# Patient Record
Sex: Female | Born: 2000 | Race: White | Hispanic: No | Smoking: Never smoker
Health system: Southern US, Community
[De-identification: ages and names within clinical notes are randomized; demographics above are authoritative.]

## PROBLEM LIST (undated history)

## (undated) DIAGNOSIS — J45990 Exercise induced bronchospasm: Secondary | ICD-10-CM

## (undated) DIAGNOSIS — N946 Dysmenorrhea, unspecified: Secondary | ICD-10-CM

## (undated) HISTORY — PX: BRAIN SURGERY: SHX531

## (undated) HISTORY — PX: KNEE ARTHROSCOPY: SUR90

## (undated) HISTORY — DX: Dysmenorrhea, unspecified: N94.6

## (undated) HISTORY — DX: Exercise induced bronchospasm: J45.990

---
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
## 2019-01-14 DIAGNOSIS — J310 Chronic rhinitis: Secondary | ICD-10-CM | POA: Diagnosis not present

## 2019-01-14 DIAGNOSIS — C412 Malignant neoplasm of vertebral column: Secondary | ICD-10-CM | POA: Diagnosis not present

## 2019-01-14 DIAGNOSIS — Z7689 Persons encountering health services in other specified circumstances: Secondary | ICD-10-CM | POA: Diagnosis not present

## 2019-02-06 DIAGNOSIS — H4923 Sixth [abducent] nerve palsy, bilateral: Secondary | ICD-10-CM | POA: Diagnosis not present

## 2019-02-11 DIAGNOSIS — C412 Malignant neoplasm of vertebral column: Secondary | ICD-10-CM | POA: Diagnosis not present

## 2019-02-11 DIAGNOSIS — J310 Chronic rhinitis: Secondary | ICD-10-CM | POA: Diagnosis not present

## 2019-04-08 DIAGNOSIS — C412 Malignant neoplasm of vertebral column: Secondary | ICD-10-CM | POA: Diagnosis not present

## 2019-04-08 DIAGNOSIS — J310 Chronic rhinitis: Secondary | ICD-10-CM | POA: Diagnosis not present

## 2019-05-04 DIAGNOSIS — N926 Irregular menstruation, unspecified: Secondary | ICD-10-CM | POA: Diagnosis not present

## 2019-05-04 DIAGNOSIS — Z309 Encounter for contraceptive management, unspecified: Secondary | ICD-10-CM | POA: Diagnosis not present

## 2019-07-10 DIAGNOSIS — H532 Diplopia: Secondary | ICD-10-CM | POA: Diagnosis not present

## 2019-07-10 DIAGNOSIS — J310 Chronic rhinitis: Secondary | ICD-10-CM | POA: Diagnosis not present

## 2019-07-10 DIAGNOSIS — J45909 Unspecified asthma, uncomplicated: Secondary | ICD-10-CM | POA: Diagnosis not present

## 2019-07-10 DIAGNOSIS — R930 Abnormal findings on diagnostic imaging of skull and head, not elsewhere classified: Secondary | ICD-10-CM | POA: Diagnosis not present

## 2019-07-10 DIAGNOSIS — D164 Benign neoplasm of bones of skull and face: Secondary | ICD-10-CM | POA: Diagnosis not present

## 2019-07-10 DIAGNOSIS — C412 Malignant neoplasm of vertebral column: Secondary | ICD-10-CM | POA: Diagnosis not present

## 2019-07-16 DIAGNOSIS — H5213 Myopia, bilateral: Secondary | ICD-10-CM | POA: Diagnosis not present

## 2019-07-21 DIAGNOSIS — J309 Allergic rhinitis, unspecified: Secondary | ICD-10-CM | POA: Diagnosis not present

## 2019-07-21 DIAGNOSIS — Z6823 Body mass index (BMI) 23.0-23.9, adult: Secondary | ICD-10-CM | POA: Diagnosis not present

## 2019-09-23 DIAGNOSIS — S01311A Laceration without foreign body of right ear, initial encounter: Secondary | ICD-10-CM | POA: Diagnosis not present

## 2019-10-12 ENCOUNTER — Ambulatory Visit (INDEPENDENT_AMBULATORY_CARE_PROVIDER_SITE_OTHER): Payer: Self-pay | Admitting: Plastic Surgery

## 2019-10-12 ENCOUNTER — Other Ambulatory Visit: Payer: Self-pay

## 2019-10-12 ENCOUNTER — Encounter: Payer: Self-pay | Admitting: Plastic Surgery

## 2019-10-12 VITALS — BP 87/61 | HR 57 | Temp 98.9°F | Ht 69.0 in | Wt 172.6 lb

## 2019-10-12 DIAGNOSIS — Z411 Encounter for cosmetic surgery: Secondary | ICD-10-CM

## 2019-10-12 NOTE — Progress Notes (Signed)
   Referring Provider Ronita Hipps, MD [place] [address],    CC:  Chief Complaint  Patient presents with  . Advice Only    right torn ear lobe from 3 weeks ago      Rachel Tyler is an 18 y.o. female.  HPI: Patient presents to discuss a torn right earlobe.  This happened a while back and is healed with a complete split.  The left earlobe is fine.  She wants to discuss surgical correction.  She is 7 months out from finishing radiation for her brain tumor which required 2 surgeries for excision.  Allergies  Allergen Reactions  . Amoxicillin Hives  . Shellfish-Derived Products Hives    Outpatient Encounter Medications as of 10/12/2019  Medication Sig  . etonogestrel-ethinyl estradiol (NUVARING) 0.12-0.015 MG/24HR vaginal ring   . NASONEX 50 MCG/ACT nasal spray ADD 1ML OF MEDICATION TO 240ML OF SALINE IN SALINE IRRIGATION BOTTLE; IRRIGATE SINUSES WITH 120ML THROUGH EACH NOSTRIL TWICE DAILY   No facility-administered encounter medications on file as of 10/12/2019.     No past medical history on file. Significant for brain tumor excision and 3 surgeries on her left knee subsequent to an injury in cheerleading. No family history on file.  Social History   Social History Narrative  . Not on file  Denies tobacco use  Review of Systems General: Denies fevers, chills, weight loss CV: Denies chest pain, shortness of breath, palpitations  Physical Exam Vitals with BMI 10/12/2019  Height 5\' 9"   Weight 172 lbs 10 oz  BMI [id number]  Systolic 87  Diastolic 61  Pulse 57    General:  No acute distress,  Alert and oriented, Non-Toxic, Normal speech and affect HEENT: Normocephalic atraumatic.  Extraocular movements intact.  Cranial nerves grossly intact.  She has a split of the right earlobe that is healed.  Assessment/Plan Patient presents with split the right earlobe.  I discussed surgical correction of this with excision and resuturing.  I discussed the risks  include bleeding, infection, need for additional procedures.  We discussed the need to wait 2 to 3 months afterwards before repiercing.  Her and her mom are fully understanding we will plan to get this scheduled under local.  Cindra Presume 10/12/2019, 1:51 PM

## 2019-10-16 DIAGNOSIS — R7989 Other specified abnormal findings of blood chemistry: Secondary | ICD-10-CM

## 2019-10-16 HISTORY — DX: Other specified abnormal findings of blood chemistry: R79.89

## 2019-11-03 ENCOUNTER — Telehealth: Payer: Self-pay

## 2019-11-03 NOTE — Telephone Encounter (Signed)
Called patient to confirm appointment scheduled for tomorrow. Patient's mother answered the following questions: 1. To the best of your knowledge, have you been in close contact with any one with a confirmed diagnosis of COVID-19? No 2. Have you had any one or more of the following; fever, chills, cough, shortness of breath, or any flu-like symptoms? No 3. Have you been diagnosed with or have a previous diagnosis of COVID 19? No 4. I am going to go over a few other symptoms with you. Please let me know if you are experiencing any of the following: None of the below a. Ear, nose, or throat discomfort b. A sore throat c. Headache d. Muscle pain e. Diarrhea f. Loss of taste or smell

## 2019-11-04 ENCOUNTER — Encounter: Payer: Self-pay | Admitting: Plastic Surgery

## 2019-11-04 ENCOUNTER — Other Ambulatory Visit: Payer: Self-pay

## 2019-11-04 ENCOUNTER — Ambulatory Visit (INDEPENDENT_AMBULATORY_CARE_PROVIDER_SITE_OTHER): Payer: Self-pay | Admitting: Plastic Surgery

## 2019-11-04 VITALS — BP 101/63 | HR 56 | Temp 98.0°F | Ht 69.0 in | Wt 165.8 lb

## 2019-11-04 DIAGNOSIS — Z411 Encounter for cosmetic surgery: Secondary | ICD-10-CM

## 2019-11-04 NOTE — Progress Notes (Signed)
Operative Note   DATE OF OPERATION: 11/04/2019  LOCATION:    SURGICAL DEPARTMENT: Plastic Surgery  PREOPERATIVE DIAGNOSES: Split right earlobe  POSTOPERATIVE DIAGNOSES:  same  PROCEDURE:  1. Right earlobe repair  SURGEON: Talmadge Coventry, MD  ANESTHESIA:  Local  COMPLICATIONS: None.   INDICATIONS FOR PROCEDURE:  The patient, Rachel Tyler is a 19 y.o. female born on May 26, 2001, is here for treatment of split right earlobe MRN: [id number]  CONSENT:  Informed consent was obtained directly from the patient. Risks, benefits and alternatives were fully discussed. Specific risks including but not limited to bleeding, infection, hematoma, seroma, scarring, pain, infection, wound healing problems, and need for further surgery were all discussed. The patient did have an ample opportunity to have questions answered to satisfaction.   DESCRIPTION OF PROCEDURE:  Local anesthesia was administered. The patient's operative site was prepped and draped in a sterile fashion. A time out was performed and all information was confirmed to be correct.  The borders of the split earlobe were excised with an 11 blade.  The rim of the lobule sutured with a mattress 5-0 Prolene suture.  The anterior surface was closed with interrupted 5-0 Prolene and the posterior surface was closed interrupted 5-0 fast gut.  She had a nice on table result.  It was dressed with ointment and a Band-Aid  The patient tolerated the procedure well.  There were no complications.

## 2019-11-18 ENCOUNTER — Encounter: Payer: Self-pay | Admitting: Surgical

## 2019-11-18 ENCOUNTER — Other Ambulatory Visit: Payer: Self-pay

## 2019-11-18 ENCOUNTER — Ambulatory Visit (INDEPENDENT_AMBULATORY_CARE_PROVIDER_SITE_OTHER): Payer: Self-pay | Admitting: Surgical

## 2019-11-18 VITALS — BP 98/63 | HR 48 | Temp 96.8°F | Ht 69.0 in | Wt 165.4 lb

## 2019-11-18 DIAGNOSIS — Z411 Encounter for cosmetic surgery: Secondary | ICD-10-CM

## 2019-11-18 NOTE — Progress Notes (Signed)
Ms. Benedum is a 19 year old female here for follow-up after right earlobe repair on 11/04/2019 with Dr. Silverio Lay pace.  She is doing well.  She has Prolene sutures in place along the anterior and inferior aspect of her right earlobe.  These were removed today.  She has healed well.  There is no sign of dehiscence.  No sign of infection.  No erythema.  She tolerated removing the sutures fine.  Patient has no complaints.  Current plan is to have her follow-up in 2 to 3 months for repiercing of the right ear with Dr. Claudia Desanctis.  Call with any questions or concerns prior to next follow-up.  Avoid any trauma to the right ear or tugging/pulling.

## 2019-12-15 ENCOUNTER — Other Ambulatory Visit: Payer: Self-pay

## 2019-12-16 ENCOUNTER — Ambulatory Visit: Payer: BLUE CROSS/BLUE SHIELD | Admitting: Obstetrics and Gynecology

## 2019-12-16 ENCOUNTER — Encounter: Payer: Self-pay | Admitting: Obstetrics and Gynecology

## 2019-12-16 ENCOUNTER — Other Ambulatory Visit (HOSPITAL_COMMUNITY)
Admission: RE | Admit: 2019-12-16 | Discharge: 2019-12-16 | Disposition: A | Discharge status: Home / Self Care | Payer: BLUE CROSS/BLUE SHIELD | Source: Ambulatory Visit | Attending: Obstetrics and Gynecology | Admitting: Obstetrics and Gynecology

## 2019-12-16 VITALS — BP 110/72 | HR 56 | Temp 97.3°F | Resp 14 | Ht 68.0 in | Wt 163.8 lb

## 2019-12-16 DIAGNOSIS — Z113 Encounter for screening for infections with a predominantly sexual mode of transmission: Secondary | ICD-10-CM

## 2019-12-16 DIAGNOSIS — Z01419 Encounter for gynecological examination (general) (routine) without abnormal findings: Secondary | ICD-10-CM | POA: Diagnosis not present

## 2019-12-16 DIAGNOSIS — R102 Pelvic and perineal pain: Secondary | ICD-10-CM

## 2019-12-16 DIAGNOSIS — R8290 Unspecified abnormal findings in urine: Secondary | ICD-10-CM

## 2019-12-16 DIAGNOSIS — N926 Irregular menstruation, unspecified: Secondary | ICD-10-CM

## 2019-12-16 LAB — POCT URINALYSIS DIPSTICK
Bilirubin, UA: NEGATIVE
Glucose, UA: NEGATIVE
Ketones, UA: NEGATIVE
Nitrite, UA: POSITIVE
Protein, UA: NEGATIVE
Urobilinogen, UA: 0.2 E.U./dL
pH, UA: 7 (ref 5.0–8.0)

## 2019-12-16 LAB — POCT URINE PREGNANCY: Preg Test, Ur: NEGATIVE

## 2019-12-16 MED ORDER — SULFAMETHOXAZOLE-TRIMETHOPRIM 800-160 MG PO TABS: 1.0000 | ORAL_TABLET | Freq: Two times a day (BID) | ORAL | 0 refills | Status: DC

## 2019-12-16 NOTE — Progress Notes (Signed)
19 y.o. G0P0000 Single Caucasian female here for annual exam.    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 She states last menses 08-19-19. She has been on Nuvaring. She is using the NuvaRing for 1.5 years.  This helps her painful menses.  She took her ring out 3 days ago. She has gained 23 pounds since last summer.  She denies HA and nipple discharge.  Denies fatigue and cold intolerance.   Patient feels like she may have a UTI.  She is having pelvic pressure with need to void, which began 2 days ago. No dysuria or burning.  Hx UTI one year ago.   UPT:  Negative.   Urine Dip: Mod.WBCs, Mod.RBCs, Pos Nitrites  PCP: Kennith Maes, MD    Patient's last menstrual period was 08/19/2019 (exact date).     Period Pattern: (!) Irregular     Sexually active: Yes.    The current method of family planning is NuvaRing vaginal inserts.    Exercising: Yes.    cheerleading Smoker:  no  Health Maintenance: Pap:  n/a History of abnormal Pap:  n/a MMG:  n/a Colonoscopy:  n/a BMD:   n/a  Result  n/a TDaP:04-03-11 Gardasil:   Yes, completed HIV:no Hep C:no Screening Labs:  PCP.    reports that she has never smoked. She has never used smokeless tobacco. She reports that she does not drink alcohol or use drugs.  Past Medical History:  Diagnosis Date  . Cancer ([place]) 01/2018   Dx'd with brain chordoma--cancerous--treated in [place]  . Dysmenorrhea   . Exercise-induced asthma     Past Surgical History:  Procedure Laterality Date  . BRAIN SURGERY  06/20/18, 08/26/2018   in [place], patient also had radiation for chordoma tumor--cancerous  . KNEE ARTHROSCOPY Left    x3    Current Outpatient Medications  Medication Sig Dispense Refill  . albuterol (VENTOLIN HFA) 108 (90 Base) MCG/ACT inhaler Inhale 1 puff into the lungs as needed.    . beclomethasone (QVAR) 40 MCG/ACT inhaler Inhale into the lungs as needed.    . etonogestrel-ethinyl estradiol (NUVARING) 0.12-0.015 MG/24HR  vaginal ring     . ibuprofen (ADVIL) 200 MG tablet Take by mouth.    . Mometasone Furoate POWD 1.2mg /11ml add 58ml of medicine to 22ml of saline; irrigate sinuses BID    . NASONEX 50 MCG/ACT nasal spray ADD 1ML OF MEDICATION TO 240ML OF SALINE IN SALINE IRRIGATION BOTTLE; IRRIGATE SINUSES WITH 120ML THROUGH EACH NOSTRIL TWICE DAILY    . sulfamethoxazole-trimethoprim (BACTRIM DS) 800-160 MG tablet Take 1 tablet by mouth 2 (two) times daily. One PO BID x 3 days 6 tablet 0   No current facility-administered medications for this visit.    History reviewed. No pertinent family history.  Review of Systems  Genitourinary: Positive for dysuria.       Pelvic pressure  All other systems reviewed and are negative.   Exam:   BP 110/72   Pulse (!) 56   Temp (!) 97.3 F (36.3 C) (Temporal)   Resp 14   Ht 5\' 8"  (1.727 m)   Wt 163 lb 12.8 oz (74.3 kg)   LMP 08/19/2019 (Exact Date)   BMI 24.91 kg/m     General appearance: alert, cooperative and appears stated age Head: normocephalic, without obvious abnormality, atraumatic Neck: no adenopathy, supple, symmetrical, trachea midline and thyroid normal to inspection and palpation Lungs: clear to auscultation bilaterally Breasts: normal appearance, no masses or tenderness, No nipple retraction or dimpling,  No nipple discharge or bleeding, No axillary adenopathy Heart: regular rate and rhythm Abdomen: soft, non-tender; no masses, no organomegaly Extremities: extremities normal, atraumatic, no cyanosis or edema Skin: skin color, texture, turgor normal. No rashes or lesions Lymph nodes: cervical, supraclavicular, and axillary nodes normal. Neurologic: grossly normal  Pelvic: External genitalia:  no lesions              No abnormal inguinal nodes palpated.              Urethra:  normal appearing urethra with no masses, tenderness or lesions              Bartholins and Skenes: normal                 Vagina: normal appearing vagina with normal color  and discharge, no lesions              Cervix: no lesions.  Menstrual flow noted.               Pap taken: No. Bimanual Exam:  Uterus:  normal size, contour, position, consistency, mobility, non-tender              Adnexa: no mass, fullness, tenderness              Chaperone was present for exam.  Assessment:   Well woman visit with normal exam. Dysuria.  Hx chordoma.  Status post surgery and XRT.  Using NuvaRing.  Missed menses with NuvaRing.   Recent weight gain.  Menstruation today.   Plan: Mammogram screening age 55.  Self breast awareness reviewed. Pap and HR HPV as above. Guidelines for Calcium, Vitamin D, regular exercise program including cardiovascular and weight bearing exercise. Will check TSH and prolactin.   STD screening.  She has refills on her Nuvaring from her PCP.  Bactrim DS po bid x 3 days.  Urine micro and cx.  Call if no improvement in bladder symptoms in 48 hours.  Follow up annually and prn.    After visit summary provided.

## 2019-12-16 NOTE — Patient Instructions (Signed)

## 2019-12-17 LAB — TSH: TSH: 0.503 u[IU]/mL (ref 0.450–4.500)

## 2019-12-17 LAB — URINALYSIS, MICROSCOPIC ONLY
Casts: NONE SEEN /lpf
WBC, UA: >30 /hpf — AB (ref 0–5)

## 2019-12-17 LAB — HEP, RPR, HIV PANEL
HIV Screen 4th Generation wRfx: NONREACTIVE
Hepatitis B Surface Ag: NEGATIVE
RPR Ser Ql: NONREACTIVE

## 2019-12-17 LAB — CERVICOVAGINAL ANCILLARY ONLY
Chlamydia: NEGATIVE
Comment: NEGATIVE
Comment: NEGATIVE
Comment: NORMAL
Neisseria Gonorrhea: NEGATIVE
Trichomonas: NEGATIVE

## 2019-12-17 LAB — HEPATITIS C ANTIBODY: Hep C Virus Ab: <0.1 s/co ratio (ref 0.0–0.9)

## 2019-12-17 LAB — PROLACTIN: Prolactin: 29 ng/mL — ABNORMAL HIGH (ref 4.8–23.3)

## 2019-12-18 LAB — URINE CULTURE

## 2019-12-19 ENCOUNTER — Telehealth: Payer: Self-pay | Admitting: Obstetrics and Gynecology

## 2019-12-19 ENCOUNTER — Encounter: Payer: Self-pay | Admitting: Obstetrics and Gynecology

## 2019-12-19 DIAGNOSIS — R7989 Other specified abnormal findings of blood chemistry: Secondary | ICD-10-CM

## 2019-12-19 MED ORDER — NITROFURANTOIN MONOHYD MACRO 100 MG PO CAPS: 100.0000 mg | ORAL_CAPSULE | Freq: Two times a day (BID) | ORAL | 0 refills | Status: DC

## 2019-12-19 NOTE — Telephone Encounter (Signed)
Phone message left on patient's cell - [phone].  Her UC shows E Coli resistant to Bactrim.  I left a message that I am sending a different abx to her pharmacy to treat the infection.  Macrobid 100 mg po bid x 5 days.  I also informed her that her prolactin is elevated and that the office will call her to schedule a fasting prolactin lab visit.   Triage, please also see result note to review remaining test results.

## 2019-12-21 NOTE — Telephone Encounter (Signed)
Spoke with patient, advised of all results as seen below per Dr. Quincy Simmonds. Patient did start new abx as directed. Repeat prolactin level scheduled for 3/9 at 8:45am. Covid19 prescreen negative, precautions reviewed. Patient verbalizes understanding and is agreeable.   Routing to provider for final review. Patient is agreeable to disposition. Will close encounter.

## 2019-12-21 NOTE — Telephone Encounter (Signed)
-----   Message from Nunzio Cobbs, MD sent at 12/19/2019  9:20 AM EST ----- Please see phone note 12/19/19 and report results to patient.   I already changed her abx to [place] for her E Coli UTI and sent that to her pharmacy now.   I informed her of the elevated prolactin, and please schedule a fasting lab visit to recheck this.   Her thyroid test is normal.   Her testing is negative for HIV, syphilis, and hepatitis B and C, gonorrhea, chlamydia, and trichomonas.

## 2019-12-22 ENCOUNTER — Other Ambulatory Visit (INDEPENDENT_AMBULATORY_CARE_PROVIDER_SITE_OTHER): Payer: BLUE CROSS/BLUE SHIELD

## 2019-12-22 ENCOUNTER — Other Ambulatory Visit: Payer: Self-pay

## 2019-12-22 DIAGNOSIS — R7989 Other specified abnormal findings of blood chemistry: Secondary | ICD-10-CM | POA: Diagnosis not present

## 2019-12-23 LAB — PROLACTIN: Prolactin: 35.2 ng/mL — ABNORMAL HIGH (ref 4.8–23.3)

## 2019-12-24 ENCOUNTER — Telehealth: Payer: Self-pay | Admitting: Obstetrics and Gynecology

## 2019-12-24 NOTE — Telephone Encounter (Signed)
Spoke with patient. Patient is requesting for her mother to talk with Dr. Quincy Simmonds regarding recommended MRI and results. Patient states she is scheduled for a brain MRI in [place] on 01/27/20.   Recommended MyChart consult so mother could be present, patient agreeable. MyChart request sent to patient via text. OV scheduled for 3/15 at 4pm with Dr. Quincy Simmonds. Patient will return call to office once she has set up MyChart, will change OV to [place] visit. Advised patient I will update Dr. Quincy Simmonds. Patient verbalizes understanding.   Routing to Dr. Antony Blackbird.

## 2019-12-24 NOTE — Telephone Encounter (Signed)
Patient contacted personally by me with results of elevated prolactin on recheck.   She has irregular menses.  She denies symptoms of loss of peripheral vision, nipple discharge, headaches.   She has a history of a chordoma of the brain treated surgically and with radiation therapy in [place] in 2019.  I recommended she proceed with an MRI of the brain to rule out a pituitary adenoma.  She is in agreement but would also like to speak with her mother about his.  Please have this precerted.

## 2019-12-28 ENCOUNTER — Ambulatory Visit: Payer: Self-pay | Admitting: Obstetrics and Gynecology

## 2019-12-30 ENCOUNTER — Encounter: Payer: Self-pay | Admitting: Obstetrics and Gynecology

## 2019-12-30 ENCOUNTER — Telehealth (INDEPENDENT_AMBULATORY_CARE_PROVIDER_SITE_OTHER): Payer: BLUE CROSS/BLUE SHIELD | Admitting: Obstetrics and Gynecology

## 2019-12-30 DIAGNOSIS — Z3009 Encounter for other general counseling and advice on contraception: Secondary | ICD-10-CM

## 2019-12-30 DIAGNOSIS — R7989 Other specified abnormal findings of blood chemistry: Secondary | ICD-10-CM | POA: Diagnosis not present

## 2019-12-30 MED ORDER — DROSPIRENONE-ETHINYL ESTRADIOL 3-0.03 MG PO TABS: 1.0000 | ORAL_TABLET | Freq: Every day | ORAL | 3 refills | Status: DC

## 2019-12-30 NOTE — Progress Notes (Signed)
GYNECOLOGY  VISIT   HPI: 19 y.o.   Single  Caucasian  female   G0P0000 with No LMP recorded.   here for consult for elevated prolactin and skipped cycles on NuvaRing.  Previously had menses with NuvaRing.  Her prolactin level was 29 and then 35.2 as a fasting recheck.   She gives permission for this My Chart video visit. Arranged by Reesa Chew, RN. Patient identity is confirmed.  I am in my office.  She is at her mother's store. Her mother is participating in the visit.  Started 2:02.  Ended 2:33.  Her mother is participating in this conversation.   She did get a menstrual cycle on 3/321 at the time of her visit here. It lasted 2 days, which was short and light for her.   Her prolactin was 28.5 in 2019 in her PCP's office.  She was asked to do blood work prior to radiation therapy for her brain chordoma.  She was having irregular menses then.   Last MRI was 07/10/19 at [facility], oncology ENT.  She will see Dr. Barton Dubois as a new provider now for her care.  She has an appointment in April, 2021.   She feels irritation with NuvaRing.  She would like taking a birth control pill.   GYNECOLOGIC HISTORY: No LMP recorded. Contraception:  Nuvaring Menopausal hormone therapy:  n/a Last mammogram:  n/a Last pap smear:   n/a        OB History    Gravida  0   Para  0   Term  0   Preterm  0   AB  0   Living  0     SAB  0   TAB  0   Ectopic  0   Multiple  0   Live Births  0              There are no problems to display for this patient.   Past Medical History:  Diagnosis Date  . Cancer ([place]) 01/2018   Dx'd with brain chordoma--cancerous--treated in [place]  . Dysmenorrhea   . Elevated prolactin level 2021  . Exercise-induced asthma     Past Surgical History:  Procedure Laterality Date  . BRAIN SURGERY  06/20/18, 08/26/2018   in [place], patient also had radiation for chordoma tumor--cancerous  . KNEE ARTHROSCOPY Left    x3    Current Outpatient  Medications  Medication Sig Dispense Refill  . albuterol (VENTOLIN HFA) 108 (90 Base) MCG/ACT inhaler Inhale 1 puff into the lungs as needed.    . beclomethasone (QVAR) 40 MCG/ACT inhaler Inhale into the lungs as needed.    . etonogestrel-ethinyl estradiol (NUVARING) 0.12-0.015 MG/24HR vaginal ring     . ibuprofen (ADVIL) 200 MG tablet Take by mouth.    . Mometasone Furoate POWD 1.2mg /73ml add 50ml of medicine to 259ml of saline; irrigate sinuses BID    . NASONEX 50 MCG/ACT nasal spray ADD 1ML OF MEDICATION TO 240ML OF SALINE IN SALINE IRRIGATION BOTTLE; IRRIGATE SINUSES WITH 120ML THROUGH EACH NOSTRIL TWICE DAILY    . nitrofurantoin, macrocrystal-monohydrate, (MACROBID) 100 MG capsule Take 1 capsule (100 mg total) by mouth 2 (two) times daily. Take for 5 days. 10 capsule 0   No current facility-administered medications for this visit.     ALLERGIES: Amoxicillin and Shellfish-derived products  History reviewed. No pertinent family history.  Social History   Socioeconomic History  . Marital status: Single    Spouse name: Not on file  .  Number of children: Not on file  . Years of education: Not on file  . Highest education level: Not on file  Occupational History  . Not on file  Tobacco Use  . Smoking status: Never Smoker  . Smokeless tobacco: Never Used  Substance and Sexual Activity  . Alcohol use: Never  . Drug use: Never  . Sexual activity: Yes    Birth control/protection: Inserts    Comment: Nuvaring  Other Topics Concern  . Not on file  Social History Narrative  . Not on file   Social Determinants of Health   Financial Resource Strain:   . Difficulty of Paying Living Expenses:   Food Insecurity:   . Worried About Charity fundraiser in the Last Year:   . Arboriculturist in the Last Year:   Transportation Needs:   . Film/video editor (Medical):   Marland Kitchen Lack of Transportation (Non-Medical):   Physical Activity:   . Days of Exercise per Week:   . Minutes of  Exercise per Session:   Stress:   . Feeling of Stress :   Social Connections:   . Frequency of Communication with Friends and Family:   . Frequency of Social Gatherings with Friends and Family:   . Attends Religious Services:   . Active Member of Clubs or Organizations:   . Attends Archivist Meetings:   Marland Kitchen Marital Status:   Intimate Partner Violence:   . Fear of Current or Ex-Partner:   . Emotionally Abused:   Marland Kitchen Physically Abused:   . Sexually Abused:     Review of Systems  All other systems reviewed and are negative.   PHYSICAL EXAMINATION:    There were no vitals taken for this visit.    General appearance: alert, cooperative and appears stated age   ASSESSMENT  Elevated prolactin level.  Contraceptive care monitoring.  Hx chordoma treated with surgical and XRT care.   PLAN  We discussed elevated prolactin level and the etiologies of this.  They will contact her oncologist in [place] and her oncologist at [facility] to discuss the elevated prolactin level.  She will have her MRI of the brain in April at [facility].  Stop Nuvaring and start Yasmin. 3 packs and 3 refills.   Instructed in use. I did discuss risk of thromboembolic events while on COCs.   Questions invited and answered. FU to follow prn after she has completed the MRI and had her oncology consultation.    An After Visit Summary was printed and given to the patient.  _31_____ minutes face to face time of which over 50% was spent in counseling.

## 2020-01-02 ENCOUNTER — Telehealth: Payer: Self-pay | Admitting: Obstetrics and Gynecology

## 2020-01-02 NOTE — Telephone Encounter (Signed)
Please place in imaging hold.   She has an MRI of the brain at [facility] on 01/27/20.  She has a history of a chordoma and has elevated prolactin.   I will need to see this report.

## 2020-01-04 NOTE — Telephone Encounter (Signed)
Patient placed in IMG hold.   Encounter closed.  

## 2020-01-10 IMAGING — MR MR ORBITS WO/W CM
17 of 19 series · 37 of 48 positions shown · IV contrast (multihance)
Comparison: None available.

CLINICAL DATA: Initial evaluation for diplopia in right eye for 3
weeks.

EXAM:
MRI HEAD AND ORBITS WITHOUT AND WITH CONTRAST
TECHNIQUE: Multiplanar, multiecho pulse sequences of the brain and surrounding
structures were obtained without and with intravenous contrast.
Multiplanar, multiecho pulse sequences of the orbits and surrounding
structures were obtained including fat saturation techniques, before
and after intravenous contrast administration.
CONTRAST:  14mL MULTIHANCE GADOBENATE DIMEGLUMINE 529 MG/ML IV SOLN

[Series 2: T1 · sagittal · 5.0mm · 0.45mm/px · 2 of 21 slices shown (1 of 3)]
[im 1/21]
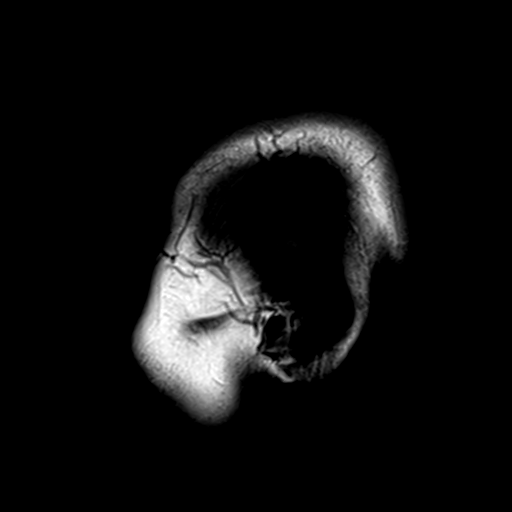
[im 21/21]
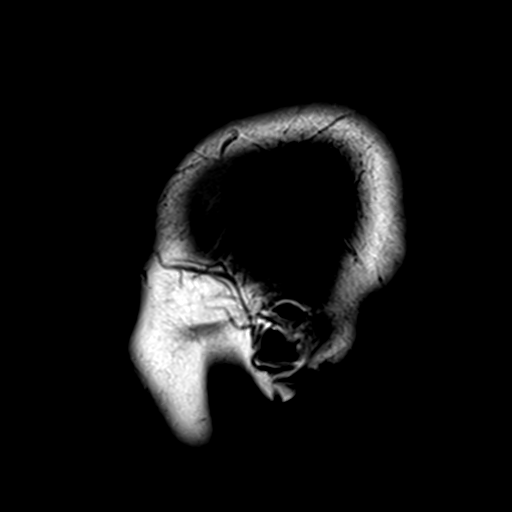

[Series 3: DWI · axial · 3.0mm · 1.80mm/px · z∈[-55,+92]mm · 6 of 100 slices shown (1 of 4)]
[im 1/100]
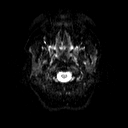
[im 20/100]
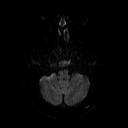
[im 40/100]
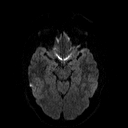
[im 60/100]
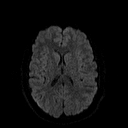
[im 80/100]
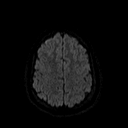
[im 100/100]
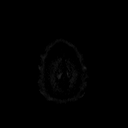

[Series 4: DWI · axial · 3.0mm · 1.80mm/px · z∈[-55,+92]mm · 3 of 49 slices shown (2 of 4)]
[im 1/49]
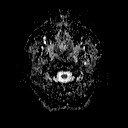
[im 25/49]
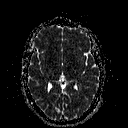
[im 49/49]
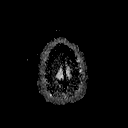

[Series 5: DWI · coronal · 5.0mm · 1.80mm/px · 4 of 72 slices shown (3 of 4)]
[im 1/72]
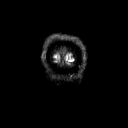
[im 24/72]
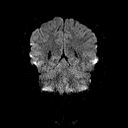
[im 48/72]
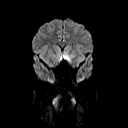
[im 72/72]
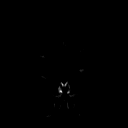

[Series 6: DWI · coronal · 5.0mm · 1.80mm/px · 2 of 36 slices shown (4 of 4)]
[im 1/36]
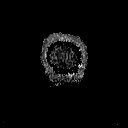
[im 36/36]
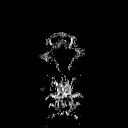

[Series 7: FLAIR · axial · 3.0mm · 0.45mm/px · 1 of 25 slices shown]
[im 1/25]
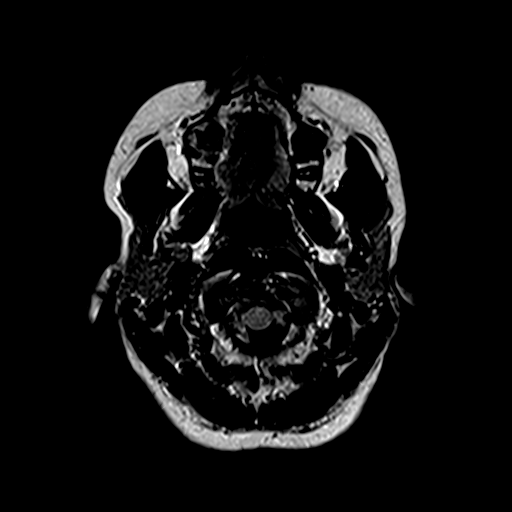

[Series 8: T2 · axial · 5.0mm · 0.51mm/px · 1 of 25 slices shown (1 of 2)]
[im 1/25]
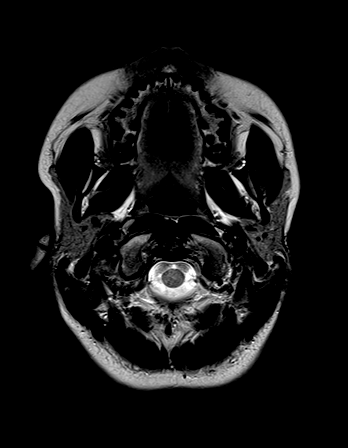

[Series 10: swi_images · axial · 2.0mm · 0.90mm/px · z∈[-63,+95]mm · 4 of 80 slices shown]
[im 1/80]
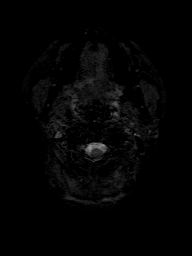
[im 27/80]
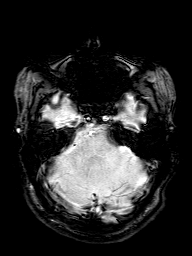
[im 53/80]
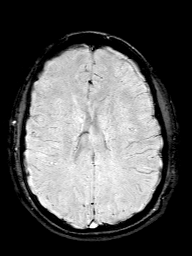
[im 80/80]
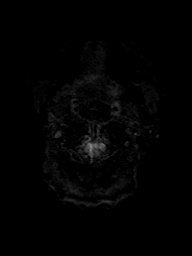

[Series 11: t1_mpr_tra copy center · axial · 1.0mm · 0.45mm/px · z∈[-60,+53]mm · 6 of 160 slices shown]
[im 1/160]
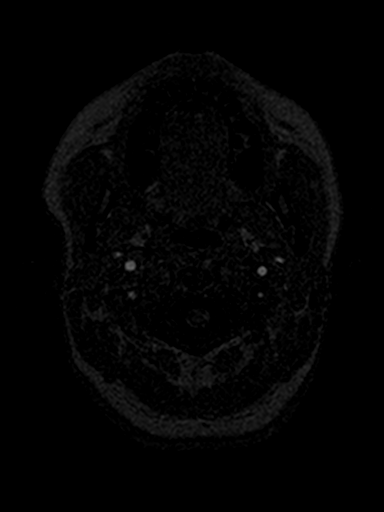
[im 23/160]
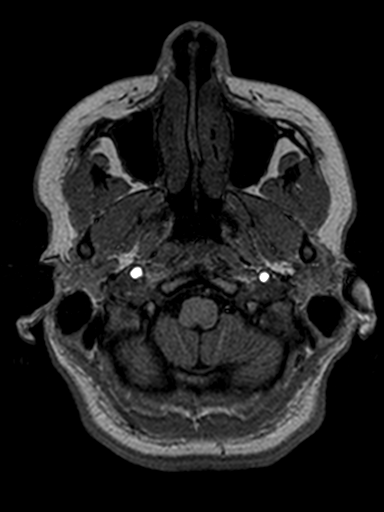
[im 46/160]
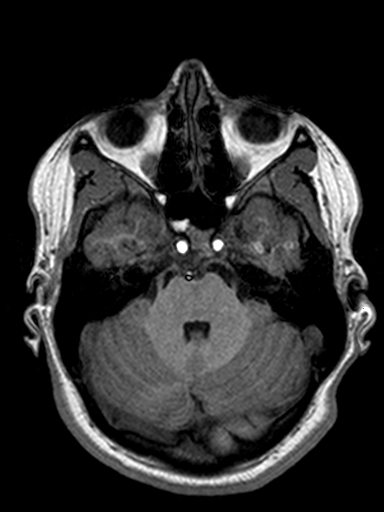
[im 69/160]
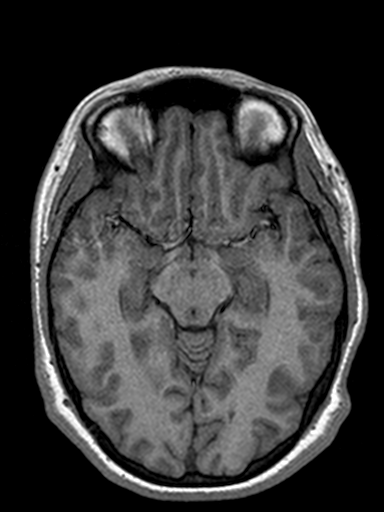
[im 91/160]
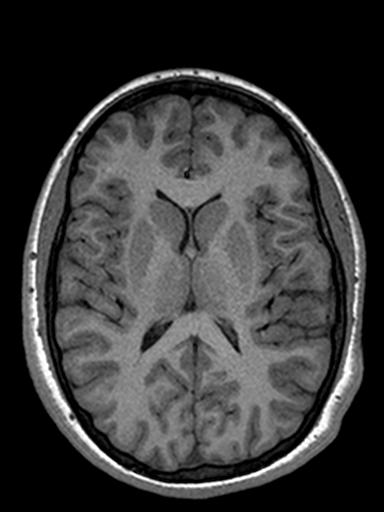
[im 114/160]
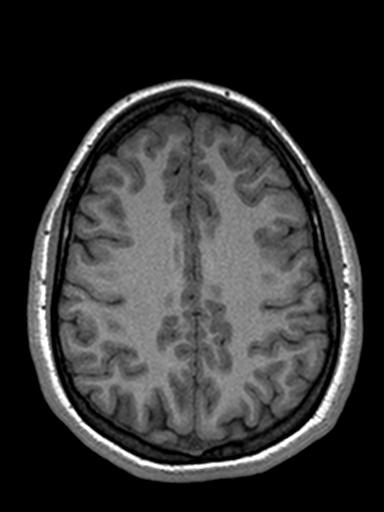

[Series 12: T2 · coronal · 5.0mm · 0.45mm/px · 1 of 29 slices shown (2 of 2)]
[im 1/29]
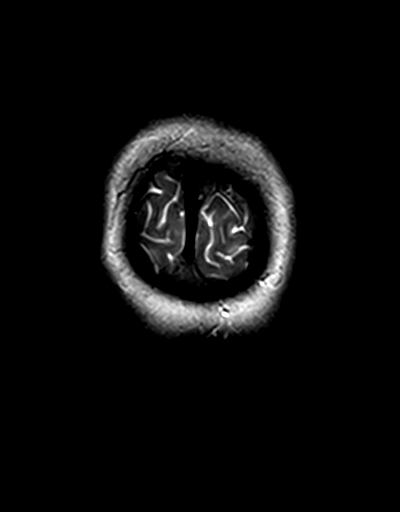

[Series 13: T1 · coronal · 3.0mm · 0.35mm/px · 1 of 26 slices shown (2 of 3)]
[im 1/26]
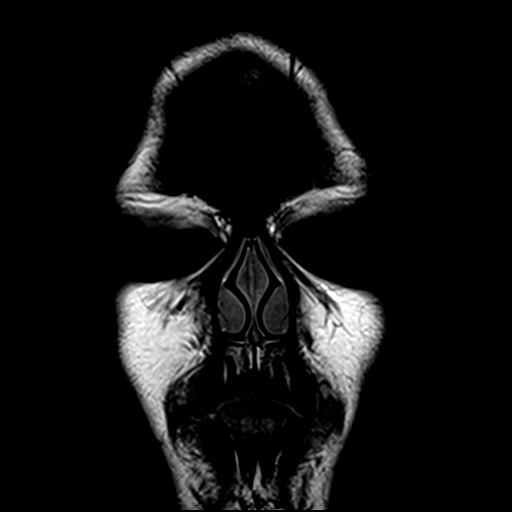

[Series 14: T1 · axial · 3.0mm · 0.35mm/px · 1 of 15 slices shown (3 of 3)]
[im 1/15]
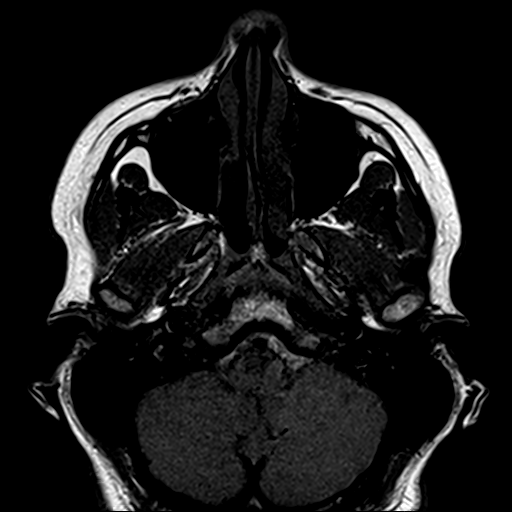

[Series 15: T2 fat-sat · coronal · 3.0mm · 0.35mm/px · 1 of 26 slices shown (1 of 2)]
[im 1/26]
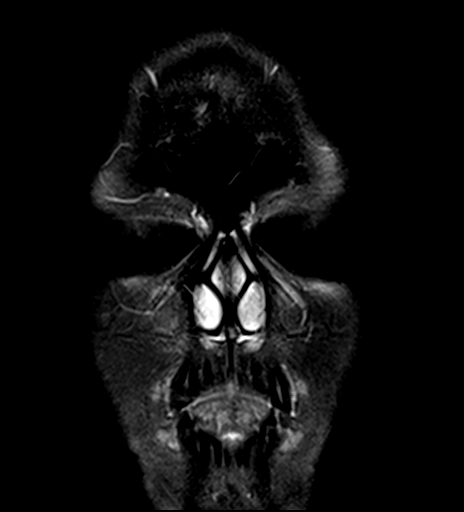

[Series 16: T2 fat-sat · axial · 3.0mm · 0.35mm/px · 1 of 15 slices shown (2 of 2)]
[im 1/15]
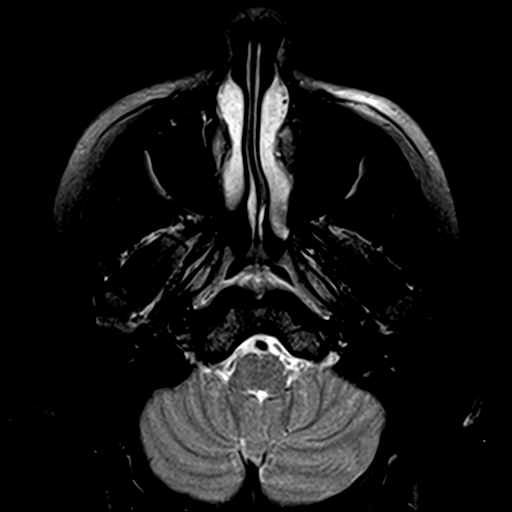

[Series 18: T1 post-contrast · coronal · 5.0mm · 0.45mm/px · 1 of 27 slices shown]
[im 1/27]
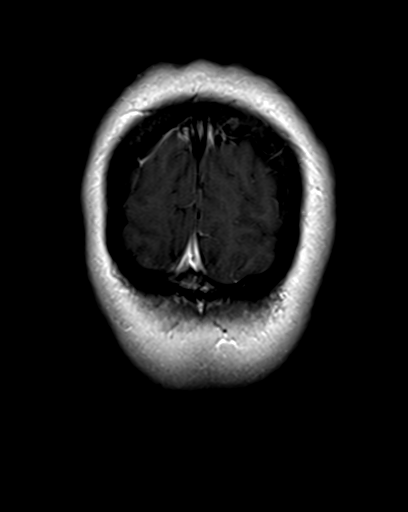

[Series 19: T1 fat-sat post-contrast · coronal · 3.0mm · 0.35mm/px · 1 of 26 slices shown (1 of 2)]
[im 1/26]
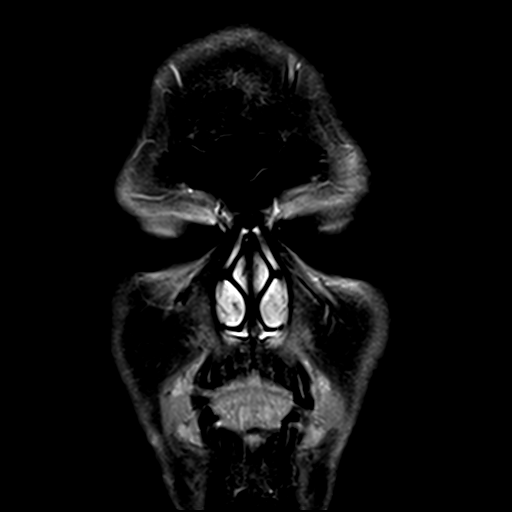

[Series 20: T1 fat-sat post-contrast · axial · 3.0mm · 0.35mm/px · 1 of 15 slices shown (2 of 2)]
[im 1/15]
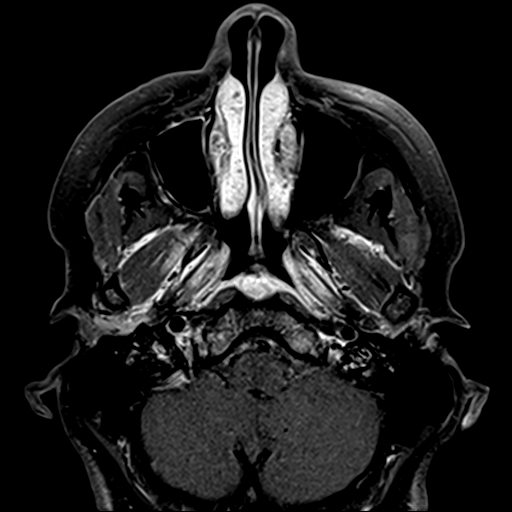

[37 of 48 positions shown; findings below may reference images not displayed]

FINDINGS: MRI HEAD FINDINGS

Brain: Cerebral volume within normal limits for age. No focal
parenchymal signal abnormality identified. No evidence for acute or
subacute infarct. No encephalomalacia to suggest chronic infarction.
No foci of susceptibility artifact to suggest acute or chronic
intracranial hemorrhage.

No mass lesion, midline shift, or mass effect. Ventricles normal in
size without hydrocephalus. There is a well-circumscribed T2
hyperintense extra-axial lesion positioned within the prepontine
cistern, just posterior to the clivus measuring 11 x 18 x 19 mm
(series 8, image 7). This lesion restricts on diffusion-weighted
imaging. No discernible internal enhancement. Finding felt to be
most consistent with an epidermoid cyst. Mild mass effect on the
ventral pons and basilar artery posteriorly. Lesion is of uncertain
clinical significance. No other extra-axial fluid collection.

Pituitary gland within normal limits.

Vascular: Major intracranial vascular flow voids are well
maintained.

Skull and upper cervical spine: Craniocervical junction normal.
Upper cervical spine normal. Bone marrow signal intensity normal. No
scalp soft tissue abnormality.

Other: No mastoid effusion.  Inner ear structures normal.

MRI ORBITS FINDINGS

Orbits: Vague artifact extends through the anterior aspect/anterior
chambers of the globes and orbits bilaterally, of uncertain
etiology. Globes are symmetric in size and otherwise normal in
appearance with normal morphology. Optic nerves symmetric and normal
bilaterally. No optic nerve edema or abnormal enhancement.
Intraconal and extraconal fat well-maintained. Extra-ocular muscles
normal. Lacrimal glands normal. Superior orbital veins within normal
limits. No abnormality at the orbital apices.

Visualized sinuses: Clear.

Soft tissues: Linear artifact extending through the pre septal soft
tissues bilaterally, of uncertain etiology. Periorbital soft tissues
otherwise unremarkable.
IMPRESSION: 1. 11 x 18 x 19 mm extra-axial T2 hyperintense lesion positioned
just to the right of midline within the prepontine cistern, favored
to reflect an epidermoid cyst. Mild mass effect on the ventral pons
posteriorly.
2. Otherwise normal MRI of the brain and orbits.

## 2020-01-27 DIAGNOSIS — C412 Malignant neoplasm of vertebral column: Secondary | ICD-10-CM | POA: Diagnosis not present

## 2020-01-27 DIAGNOSIS — Z923 Personal history of irradiation: Secondary | ICD-10-CM | POA: Diagnosis not present

## 2020-01-27 DIAGNOSIS — D164 Benign neoplasm of bones of skull and face: Secondary | ICD-10-CM | POA: Diagnosis not present

## 2020-01-27 DIAGNOSIS — J310 Chronic rhinitis: Secondary | ICD-10-CM | POA: Diagnosis not present

## 2020-01-27 DIAGNOSIS — D367 Benign neoplasm of other specified sites: Secondary | ICD-10-CM | POA: Diagnosis not present

## 2020-02-03 ENCOUNTER — Ambulatory Visit (INDEPENDENT_AMBULATORY_CARE_PROVIDER_SITE_OTHER): Payer: Self-pay | Admitting: Plastic Surgery

## 2020-02-03 ENCOUNTER — Other Ambulatory Visit: Payer: Self-pay

## 2020-02-03 ENCOUNTER — Encounter: Payer: Self-pay | Admitting: Plastic Surgery

## 2020-02-03 VITALS — BP 110/67 | HR 50 | Temp 98.0°F | Ht 69.0 in | Wt 165.0 lb

## 2020-02-03 DIAGNOSIS — Z411 Encounter for cosmetic surgery: Secondary | ICD-10-CM

## 2020-02-03 NOTE — Progress Notes (Signed)
Patient presents for piercing of the right ear.  I previously done a split earlobe repair for her which is healed nicely.  Identified an appropriate location that symmetric with the piercing on her contralateral ear.  I placed a mark here and confirmed with the patient that it is a suitable location.  The ear was then prepped with alcohol and the piercing gun was used to pierce it.  This went well and I answered all of her questions.

## 2020-02-15 ENCOUNTER — Telehealth: Payer: Self-pay | Admitting: *Deleted

## 2020-02-15 NOTE — Telephone Encounter (Signed)
Patient is in [place] hold for brain MRI w/wo contrast for elevated prolactin level.   Brain MRI w/wo contrast completed at [facility] on 01/27/20 Report in [place].   Dr. Quincy Simmonds -ok to remove from IMG hold?

## 2020-02-23 NOTE — Telephone Encounter (Signed)
Spoke with patient, she is currently at [facility], will return call at a later time to discuss.

## 2020-02-23 NOTE — Telephone Encounter (Signed)
MRI report 01/27/20 reviewed. 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   Patient was going to reach out to her oncologist in [place] regarding her elevated prolactin.  Would it be possible to get medical records from this provider about this?  I would be happy to refer her to an endocrinologist locally who can follow along with this process given her history of the chordoma.

## 2020-03-04 NOTE — Telephone Encounter (Signed)
Please reach out to patient regarding follow up of her elevated prolactin and her recent MRI imaging.  I would like some follow up regarding her oncologist's opinion.   It this information is not available, I would to refer her to an endocrinologist in [place].

## 2020-03-07 NOTE — Telephone Encounter (Signed)
Ok for referral to [facility] endocrinology for mildly elevated prolactin levels.

## 2020-03-07 NOTE — Telephone Encounter (Signed)
Spoke with patients mother, Rachel Tyler, ok per dpr. Mom reports menses have been regular since she started OCP. States she discussed elevated prolactin level with ENT, "reports no concerns". States the only testing done by oncologist in [place] was "baseline testing", she can request these records, if needed. Mom states she would prefer referral in [facility] system, she is going to reach out to patients care team at [facility] for endocrinology recommendations and return call to office to advise where she would like referral to be sent.   Routing to Dr. Antony Blackbird.  Will keep open encounter for f/u of referral.

## 2020-03-21 DIAGNOSIS — Z3201 Encounter for pregnancy test, result positive: Secondary | ICD-10-CM | POA: Diagnosis not present

## 2020-03-22 NOTE — Telephone Encounter (Signed)
Call to patients mother, Benjamine Mola, ok per dpr. Called to f/u on referral to endocrinology. Mom states her daughter was seen by her PCP on 03/21/20, UPT positive, waiting on blood pregnancy test results to confirm. States PCP also repeated prolactin levels due to her history. Mom declines referral at this time until they have results from PCP. Mom states she will return call to office if pregnancy test is positive. Advised I will provide an update to Dr. Quincy Simmonds and return call with recommendations. Mom agreeable.   Routing to Dr. Quincy Simmonds to review.

## 2020-03-22 NOTE — Telephone Encounter (Signed)
Will await results of pregnancy test.  Pregnancy will elevate her prolactin level, so this would not be followed during pregnancy care.

## 2020-03-31 NOTE — Telephone Encounter (Signed)
Call placed to patients mother, Rachel Tyler for update, Left message to call Sharee Pimple, RN at [place].

## 2020-04-12 NOTE — Telephone Encounter (Signed)
Spoke with patients mother, Rachel Tyler. OK per dpr. Mom reports patient is pregnant, she is scheduled to see OBGYN on 04/14/20. Advised as seen below per Dr. Quincy Simmonds. Mom states patient will f/u after pregnancy. Advised I will update Dr. Quincy Simmonds and return call if any additional recommendations. Patient agreeable.   Dr. Quincy Simmonds -ok to remove from IMG hold?

## 2020-04-13 NOTE — Telephone Encounter (Signed)
Ok to remove from imaging hold.

## 2020-04-13 NOTE — Telephone Encounter (Signed)
Removed from IMG hold.   Encounter closed.

## 2020-04-14 DIAGNOSIS — N911 Secondary amenorrhea: Secondary | ICD-10-CM | POA: Diagnosis not present

## 2020-04-14 DIAGNOSIS — Z3201 Encounter for pregnancy test, result positive: Secondary | ICD-10-CM | POA: Diagnosis not present

## 2020-04-27 DIAGNOSIS — Z3689 Encounter for other specified antenatal screening: Secondary | ICD-10-CM | POA: Diagnosis not present

## 2020-04-27 DIAGNOSIS — O26891 Other specified pregnancy related conditions, first trimester: Secondary | ICD-10-CM | POA: Diagnosis not present

## 2020-04-27 DIAGNOSIS — Z3A10 10 weeks gestation of pregnancy: Secondary | ICD-10-CM | POA: Diagnosis not present

## 2020-04-27 DIAGNOSIS — Z113 Encounter for screening for infections with a predominantly sexual mode of transmission: Secondary | ICD-10-CM | POA: Diagnosis not present

## 2020-04-27 LAB — OB RESULTS CONSOLE RPR: RPR: NONREACTIVE

## 2020-04-27 LAB — OB RESULTS CONSOLE HEPATITIS B SURFACE ANTIGEN: Hepatitis B Surface Ag: NEGATIVE

## 2020-04-27 LAB — OB RESULTS CONSOLE RUBELLA ANTIBODY, IGM: Rubella: IMMUNE

## 2020-04-27 LAB — OB RESULTS CONSOLE HIV ANTIBODY (ROUTINE TESTING): HIV: NONREACTIVE

## 2020-04-27 LAB — OB RESULTS CONSOLE GC/CHLAMYDIA
Chlamydia: NEGATIVE
Gonorrhea: NEGATIVE

## 2020-05-04 DIAGNOSIS — J310 Chronic rhinitis: Secondary | ICD-10-CM | POA: Diagnosis not present

## 2020-05-25 DIAGNOSIS — O2341 Unspecified infection of urinary tract in pregnancy, first trimester: Secondary | ICD-10-CM | POA: Diagnosis not present

## 2020-06-16 DIAGNOSIS — O2341 Unspecified infection of urinary tract in pregnancy, first trimester: Secondary | ICD-10-CM | POA: Diagnosis not present

## 2020-06-16 DIAGNOSIS — Z363 Encounter for antenatal screening for malformations: Secondary | ICD-10-CM | POA: Diagnosis not present

## 2020-06-16 DIAGNOSIS — Z3A18 18 weeks gestation of pregnancy: Secondary | ICD-10-CM | POA: Diagnosis not present

## 2020-07-20 DIAGNOSIS — O2342 Unspecified infection of urinary tract in pregnancy, second trimester: Secondary | ICD-10-CM | POA: Diagnosis not present

## 2020-08-01 DIAGNOSIS — C412 Malignant neoplasm of vertebral column: Secondary | ICD-10-CM | POA: Diagnosis not present

## 2020-08-09 DIAGNOSIS — Z23 Encounter for immunization: Secondary | ICD-10-CM | POA: Diagnosis not present

## 2020-08-24 DIAGNOSIS — Z6741 Type O blood, Rh negative: Secondary | ICD-10-CM | POA: Diagnosis not present

## 2020-08-24 DIAGNOSIS — Z23 Encounter for immunization: Secondary | ICD-10-CM | POA: Diagnosis not present

## 2020-08-24 DIAGNOSIS — Z6791 Unspecified blood type, Rh negative: Secondary | ICD-10-CM | POA: Diagnosis not present

## 2020-08-24 DIAGNOSIS — Z3689 Encounter for other specified antenatal screening: Secondary | ICD-10-CM | POA: Diagnosis not present

## 2020-10-15 NOTE — L&D Delivery Note (Signed)
DELIVERY NOTE  Pt complete and at +2 station with urge to push. Epidural controlling pain. Pt pushed and delivered a viable female infant in ROA position. Anterior and posterior shoulders spontaneously delivered with next two pushes; body easily followed next. Infant placed on mothers abdomen and bulb suction of mouth and nose performed. Cord was then clamped and cut by FOB. Cord blood obtained, 3VC. Baby had a vigorous spontaneous cry noted. Placenta then delivered at 1601 intact. Fundal massage performed and pitocin per protocol. Fundus firm. The following lacerations were noted: BL sulcal to labial extension. Repaired in routine fashion with 2-0 vicryl and 3-0 monocryl, respectively Mother and baby stable. Counts correct. EBL 302cc  Infant time: 69 Gender: female, desires circ Placenta time: 1601 Apgars: 9/9 Weight: pending skin-to-skin

## 2020-10-24 LAB — OB RESULTS CONSOLE GBS: GBS: POSITIVE

## 2020-11-02 ENCOUNTER — Encounter (HOSPITAL_COMMUNITY): Payer: Self-pay | Admitting: *Deleted

## 2020-11-02 ENCOUNTER — Telehealth (HOSPITAL_COMMUNITY): Payer: Self-pay | Admitting: *Deleted

## 2020-11-02 NOTE — Telephone Encounter (Signed)
Preadmission screen  

## 2020-11-04 ENCOUNTER — Inpatient Hospital Stay (HOSPITAL_COMMUNITY)
Admission: AD | Admit: 2020-11-04 | Discharge: 2020-11-04 | Disposition: A | Discharge status: Home / Self Care | Payer: PRIVATE HEALTH INSURANCE | Attending: Obstetrics and Gynecology | Admitting: Obstetrics and Gynecology

## 2020-11-04 ENCOUNTER — Other Ambulatory Visit: Payer: Self-pay

## 2020-11-04 ENCOUNTER — Encounter (HOSPITAL_COMMUNITY): Payer: Self-pay | Admitting: Obstetrics and Gynecology

## 2020-11-04 DIAGNOSIS — O98813 Other maternal infectious and parasitic diseases complicating pregnancy, third trimester: Secondary | ICD-10-CM | POA: Diagnosis not present

## 2020-11-04 DIAGNOSIS — O26893 Other specified pregnancy related conditions, third trimester: Secondary | ICD-10-CM | POA: Diagnosis present

## 2020-11-04 DIAGNOSIS — N898 Other specified noninflammatory disorders of vagina: Secondary | ICD-10-CM | POA: Diagnosis not present

## 2020-11-04 DIAGNOSIS — B373 Candidiasis of vulva and vagina: Secondary | ICD-10-CM | POA: Diagnosis not present

## 2020-11-04 DIAGNOSIS — Z3A38 38 weeks gestation of pregnancy: Secondary | ICD-10-CM | POA: Diagnosis not present

## 2020-11-04 DIAGNOSIS — R030 Elevated blood-pressure reading, without diagnosis of hypertension: Secondary | ICD-10-CM

## 2020-11-04 DIAGNOSIS — O99891 Other specified diseases and conditions complicating pregnancy: Secondary | ICD-10-CM

## 2020-11-04 DIAGNOSIS — Z0371 Encounter for suspected problem with amniotic cavity and membrane ruled out: Secondary | ICD-10-CM

## 2020-11-04 DIAGNOSIS — Z3689 Encounter for other specified antenatal screening: Secondary | ICD-10-CM

## 2020-11-04 LAB — CBC
HCT: 31.2 % — ABNORMAL LOW (ref 36.0–46.0)
Hemoglobin: 10.3 g/dL — ABNORMAL LOW (ref 12.0–15.0)
MCH: 28.8 pg (ref 26.0–34.0)
MCHC: 33 g/dL (ref 30.0–36.0)
MCV: 87.2 fL (ref 80.0–100.0)
Platelets: 313 10*3/uL (ref 150–400)
RBC: 3.58 MIL/uL — ABNORMAL LOW (ref 3.87–5.11)
RDW: 13 % (ref 11.5–15.5)
WBC: 12.7 10*3/uL — ABNORMAL HIGH (ref 4.0–10.5)
nRBC: 0 % (ref 0.0–0.2)

## 2020-11-04 LAB — PROTEIN / CREATININE RATIO, URINE
Creatinine, Urine: 104.42 mg/dL
Protein Creatinine Ratio: 0.18 mg/mg{Cre} — ABNORMAL HIGH (ref 0.00–0.15)
Total Protein, Urine: 19 mg/dL

## 2020-11-04 LAB — COMPREHENSIVE METABOLIC PANEL
ALT: 9 U/L (ref 0–44)
AST: 16 U/L (ref 15–41)
Albumin: 2.8 g/dL — ABNORMAL LOW (ref 3.5–5.0)
Alkaline Phosphatase: 112 U/L (ref 38–126)
Anion gap: 9 (ref 5–15)
BUN: 5 mg/dL — ABNORMAL LOW (ref 6–20)
CO2: 21 mmol/L — ABNORMAL LOW (ref 22–32)
Calcium: 8.4 mg/dL — ABNORMAL LOW (ref 8.9–10.3)
Chloride: 104 mmol/L (ref 98–111)
Creatinine, Ser: 0.62 mg/dL (ref 0.44–1.00)
GFR, Estimated: >60 mL/min (ref >60)
Glucose, Bld: 93 mg/dL (ref 70–99)
Potassium: 3.4 mmol/L — ABNORMAL LOW (ref 3.5–5.1)
Sodium: 134 mmol/L — ABNORMAL LOW (ref 135–145)
Total Bilirubin: 0.6 mg/dL (ref 0.3–1.2)
Total Protein: 6.1 g/dL — ABNORMAL LOW (ref 6.5–8.1)

## 2020-11-04 LAB — POCT FERN TEST: POCT Fern Test: NEGATIVE

## 2020-11-04 MED ORDER — TERCONAZOLE 0.4 % VA CREA: 1.0000 | TOPICAL_CREAM | Freq: Every day | VAGINAL | 0 refills | Status: DC

## 2020-11-04 NOTE — MAU Note (Signed)
Pt stated she went to the BR and wiped and she  Had a lot of mucus come out. Has been havining increased discharge possible leaking since.Good fetal movement no ctx.

## 2020-11-04 NOTE — Discharge Instructions (Signed)

## 2020-11-04 NOTE — MAU Provider Note (Signed)
S: [name] is a 20 y.o. G1P0000 at [redacted]w[redacted]d  who presents to MAU today complaining of leaking of fluid since yesterday. She denies vaginal bleeding. She denies contractions. She reports normal fetal movement.    O: BP (!) 142/76   Pulse 76   Temp 98.8 F (37.1 C)   Resp 18   LMP 12/28/2019 (Approximate)    Vitals:   11/04/20 2036 11/04/20 2101 11/04/20 2116 11/04/20 2131  BP: 139/72 134/74 (!) 124/57 125/69  Pulse: 71  70 84  Resp:      Temp:        GENERAL: Well-developed, well-nourished female in no acute distress.  HEAD: Normocephalic, atraumatic.  CHEST: Normal effort of breathing, regular heart rate ABDOMEN: Soft, nontender, gravid PELVIC: Normal external female genitalia. Vagina is pink and rugated. Cervix with normal contour, no lesions. Moderate amt curdy thick discharge.  Small amt pooling. [place].  Cervical exam:  Deferred by provider  Fetal Monitoring: FHT: 145 bpm, Mod Var, -Decels, +Accels Toco: Occasional  Results for orders placed or performed during the hospital encounter of 11/04/20 (from the past 24 hour(s))  Protein / creatinine ratio, urine     Status: Abnormal   Collection Time: 11/04/20  8:34 PM  Result Value Ref Range   Creatinine, Urine 104.42 mg/dL   Total Protein, Urine 19 mg/dL   Protein Creatinine Ratio 0.18 (H) 0.00 - 0.15 mg/mg[Cre]  CBC     Status: Abnormal   Collection Time: 11/04/20  9:17 PM  Result Value Ref Range   WBC 12.7 (H) 4.0 - 10.5 K/uL   RBC 3.58 (L) 3.87 - 5.11 MIL/uL   Hemoglobin 10.3 (L) 12.0 - 15.0 g/dL   HCT 31.2 (L) 36.0 - 46.0 %   MCV 87.2 80.0 - 100.0 fL   MCH 28.8 26.0 - 34.0 pg   MCHC 33.0 30.0 - 36.0 g/dL   RDW 13.0 11.5 - 15.5 %   Platelets 313 150 - 400 K/uL   nRBC 0.0 0.0 - 0.2 %  Comprehensive metabolic panel     Status: Abnormal   Collection Time: 11/04/20  9:17 PM  Result Value Ref Range   Sodium 134 (L) 135 - 145 mmol/L   Potassium 3.4 (L) 3.5 - 5.1 mmol/L   Chloride 104 98 - 111 mmol/L    CO2 21 (L) 22 - 32 mmol/L   Glucose, Bld 93 70 - 99 mg/dL   BUN 5 (L) 6 - 20 mg/dL   Creatinine, Ser 0.62 0.44 - 1.00 mg/dL   Calcium 8.4 (L) 8.9 - 10.3 mg/dL   Total Protein 6.1 (L) 6.5 - 8.1 g/dL   Albumin 2.8 (L) 3.5 - 5.0 g/dL   AST 16 15 - 41 U/L   ALT 9 0 - 44 U/L   Alkaline Phosphatase 112 38 - 126 U/L   Total Bilirubin 0.6 0.3 - 1.2 mg/dL   GFR, Estimated >60 >60 mL/min   Anion gap 9 5 - 15  POCT fern test     Status: Normal   Collection Time: 11/04/20  9:39 PM  Result Value Ref Range   POCT Fern Test Negative = intact amniotic membranes      A: SIUP at [redacted]w[redacted]d  Membranes intact  Elevated BP NST Reactive Candidiasis of Vagina  P: -Fern collected and negative. -BP elevated with initials at 146/72 and 142/76. -Labs ordered and patient informed of POC. -Patient denies HA, visual disturbances, and RUQ pain. -Informed that discharge is reflective of yeast  infection. Will treat if discharged.  -Will await results.   Gavin Pound, CNM 11/04/2020 8:33 PM  Reassessment (9:42 PM) -BP significantly improved and more reflective of office blood pressures. -Dr. Joyice Faster consulted regarding discharge with follow up on Monday if PreE labs return normal.  Agrees with plan. -Informed that script for Terazol cream would be sent to pharmacy on file if discharged.  -Patient and SO updated on POC and without questions or concerns.  -Will await results.  Reassessment (10:56 PM) Vitals:   11/04/20 2146 11/04/20 2201 11/04/20 2216 11/04/20 2231  BP: 127/71 123/68 127/77 116/70  Pulse: 73 (!) 57 84 62  Resp:      Temp:        -Results return without significant findings. -BP remain stable. -Provider to bedside to discuss with patient.  -No questions or concerns. -Rx sent to pharmacy. -Message left with [facility] for patient to receive BP check on Monday.  -Encouraged to call or return to MAU if symptoms worsen or with the onset of new symptoms. -Discharged to home  in stable condition.  Maryann Conners MSN, CNM Advanced Practice Provider, [facility] for [employer] [facility]

## 2020-11-07 ENCOUNTER — Other Ambulatory Visit (HOSPITAL_COMMUNITY): Payer: Self-pay

## 2020-11-08 ENCOUNTER — Other Ambulatory Visit: Payer: Self-pay | Admitting: Obstetrics and Gynecology

## 2020-11-09 ENCOUNTER — Other Ambulatory Visit: Payer: Self-pay

## 2020-11-09 ENCOUNTER — Inpatient Hospital Stay (HOSPITAL_COMMUNITY)
Admission: AD | Admit: 2020-11-09 | Discharge: 2020-11-11 | DRG: 805 | Disposition: A | Discharge status: Home / Self Care | Payer: Medicaid Other | Attending: Obstetrics and Gynecology | Admitting: Obstetrics and Gynecology

## 2020-11-09 ENCOUNTER — Inpatient Hospital Stay (HOSPITAL_COMMUNITY): Payer: Medicaid Other | Admitting: Anesthesiology

## 2020-11-09 ENCOUNTER — Inpatient Hospital Stay (HOSPITAL_COMMUNITY): Payer: Medicaid Other

## 2020-11-09 ENCOUNTER — Encounter (HOSPITAL_COMMUNITY): Payer: Self-pay | Admitting: Obstetrics and Gynecology

## 2020-11-09 DIAGNOSIS — O26893 Other specified pregnancy related conditions, third trimester: Secondary | ICD-10-CM | POA: Diagnosis present

## 2020-11-09 DIAGNOSIS — Z3A39 39 weeks gestation of pregnancy: Secondary | ICD-10-CM | POA: Diagnosis not present

## 2020-11-09 DIAGNOSIS — O9852 Other viral diseases complicating childbirth: Secondary | ICD-10-CM | POA: Diagnosis present

## 2020-11-09 DIAGNOSIS — Z6791 Unspecified blood type, Rh negative: Secondary | ICD-10-CM | POA: Diagnosis not present

## 2020-11-09 DIAGNOSIS — U071 COVID-19: Secondary | ICD-10-CM | POA: Diagnosis present

## 2020-11-09 DIAGNOSIS — Z85841 Personal history of malignant neoplasm of brain: Secondary | ICD-10-CM

## 2020-11-09 DIAGNOSIS — O99824 Streptococcus B carrier state complicating childbirth: Principal | ICD-10-CM | POA: Diagnosis present

## 2020-11-09 LAB — SARS CORONAVIRUS 2 BY RT PCR (HOSPITAL ORDER, PERFORMED IN ~~LOC~~ HOSPITAL LAB): SARS Coronavirus 2: POSITIVE — AB

## 2020-11-09 LAB — RPR: RPR Ser Ql: NONREACTIVE

## 2020-11-09 LAB — TYPE AND SCREEN
ABO/RH(D): O NEG
Antibody Screen: POSITIVE

## 2020-11-09 LAB — CBC
HCT: 31.1 % — ABNORMAL LOW (ref 36.0–46.0)
Hemoglobin: 10.3 g/dL — ABNORMAL LOW (ref 12.0–15.0)
MCH: 28.8 pg (ref 26.0–34.0)
MCHC: 33.1 g/dL (ref 30.0–36.0)
MCV: 86.9 fL (ref 80.0–100.0)
Platelets: 316 10*3/uL (ref 150–400)
RBC: 3.58 MIL/uL — ABNORMAL LOW (ref 3.87–5.11)
RDW: 13.2 % (ref 11.5–15.5)
WBC: 13.9 10*3/uL — ABNORMAL HIGH (ref 4.0–10.5)
nRBC: 0 % (ref 0.0–0.2)

## 2020-11-09 MED ORDER — BENZOCAINE-MENTHOL 20-0.5 % EX AERO
1.0000 "application " | INHALATION_SPRAY | CUTANEOUS | Status: DC | PRN
  Administered 2020-11-09: 1 via TOPICAL
  Filled 2020-11-09: qty 56

## 2020-11-09 MED ORDER — ACETAMINOPHEN 325 MG PO TABS: 650.0000 mg | ORAL_TABLET | ORAL | Status: DC | PRN

## 2020-11-09 MED ORDER — LACTATED RINGERS IV SOLN: 500.0000 mL | Freq: Once | INTRAVENOUS | Status: DC

## 2020-11-09 MED ORDER — FENTANYL-BUPIVACAINE-NACL 0.5-0.125-0.9 MG/250ML-% EP SOLN
EPIDURAL | Status: AC
  Filled 2020-11-09: qty 250

## 2020-11-09 MED ORDER — OXYCODONE-ACETAMINOPHEN 5-325 MG PO TABS
1.0000 | ORAL_TABLET | ORAL | Status: DC | PRN
Start: 2020-11-09 — End: 2020-11-09

## 2020-11-09 MED ORDER — ONDANSETRON HCL 4 MG/2ML IJ SOLN: 4.0000 mg | INTRAMUSCULAR | Status: DC | PRN

## 2020-11-09 MED ORDER — LACTATED RINGERS IV SOLN: INTRAVENOUS | Status: DC

## 2020-11-09 MED ORDER — SENNOSIDES-DOCUSATE SODIUM 8.6-50 MG PO TABS
2.0000 | ORAL_TABLET | Freq: Every day | ORAL | Status: DC
  Administered 2020-11-10 – 2020-11-11 (×2): 2 via ORAL
  Filled 2020-11-09 (×2): qty 2

## 2020-11-09 MED ORDER — BUPIVACAINE HCL (PF) 0.25 % IJ SOLN
INTRAMUSCULAR | Status: DC | PRN
  Administered 2020-11-09: 5 mL via EPIDURAL

## 2020-11-09 MED ORDER — LIDOCAINE HCL (PF) 1 % IJ SOLN: 30.0000 mL | INTRAMUSCULAR | Status: DC | PRN

## 2020-11-09 MED ORDER — FLEET ENEMA 7-19 GM/118ML RE ENEM: 1.0000 | ENEMA | RECTAL | Status: DC | PRN

## 2020-11-09 MED ORDER — PHENYLEPHRINE 40 MCG/ML (10ML) SYRINGE FOR IV PUSH (FOR BLOOD PRESSURE SUPPORT): 80.0000 ug | PREFILLED_SYRINGE | INTRAVENOUS | Status: DC | PRN

## 2020-11-09 MED ORDER — DIPHENHYDRAMINE HCL 50 MG/ML IJ SOLN: 12.5000 mg | INTRAMUSCULAR | Status: DC | PRN

## 2020-11-09 MED ORDER — ZOLPIDEM TARTRATE 5 MG PO TABS: 5.0000 mg | ORAL_TABLET | Freq: Every evening | ORAL | Status: DC | PRN

## 2020-11-09 MED ORDER — ONDANSETRON HCL 4 MG PO TABS: 4.0000 mg | ORAL_TABLET | ORAL | Status: DC | PRN

## 2020-11-09 MED ORDER — DIBUCAINE (PERIANAL) 1 % EX OINT: 1.0000 "application " | TOPICAL_OINTMENT | CUTANEOUS | Status: DC | PRN

## 2020-11-09 MED ORDER — MISOPROSTOL 25 MCG QUARTER TABLET
25.0000 ug | ORAL_TABLET | ORAL | Status: DC | PRN
  Administered 2020-11-09 (×2): 25 ug via VAGINAL
  Filled 2020-11-09 (×2): qty 1

## 2020-11-09 MED ORDER — FENTANYL-BUPIVACAINE-NACL 0.5-0.125-0.9 MG/250ML-% EP SOLN
12.0000 mL/h | EPIDURAL | Status: DC | PRN
Start: 2020-11-09 — End: 2020-11-09

## 2020-11-09 MED ORDER — SOD CITRATE-CITRIC ACID 500-334 MG/5ML PO SOLN: 30.0000 mL | ORAL | Status: DC | PRN

## 2020-11-09 MED ORDER — CLINDAMYCIN PHOSPHATE 900 MG/50ML IV SOLN: 900.0000 mg | Freq: Three times a day (TID) | INTRAVENOUS | Status: DC

## 2020-11-09 MED ORDER — OXYTOCIN BOLUS FROM INFUSION
333.0000 mL | Freq: Once | INTRAVENOUS | Status: AC
  Administered 2020-11-09: 333 mL via INTRAVENOUS

## 2020-11-09 MED ORDER — OXYCODONE-ACETAMINOPHEN 5-325 MG PO TABS: 2.0000 | ORAL_TABLET | ORAL | Status: DC | PRN

## 2020-11-09 MED ORDER — CEFAZOLIN SODIUM-DEXTROSE 2-4 GM/100ML-% IV SOLN
2.0000 g | Freq: Once | INTRAVENOUS | Status: AC
  Administered 2020-11-09: 2 g via INTRAVENOUS
  Filled 2020-11-09: qty 100

## 2020-11-09 MED ORDER — OXYTOCIN-SODIUM CHLORIDE 30-0.9 UT/500ML-% IV SOLN
2.5000 [IU]/h | INTRAVENOUS | Status: DC
  Filled 2020-11-09: qty 500

## 2020-11-09 MED ORDER — EPHEDRINE 5 MG/ML INJ: 10.0000 mg | INTRAVENOUS | Status: DC | PRN

## 2020-11-09 MED ORDER — SIMETHICONE 80 MG PO CHEW: 80.0000 mg | CHEWABLE_TABLET | ORAL | Status: DC | PRN

## 2020-11-09 MED ORDER — BUTORPHANOL TARTRATE 1 MG/ML IJ SOLN
1.0000 mg | INTRAMUSCULAR | Status: DC | PRN
Start: 2020-11-09 — End: 2020-11-09

## 2020-11-09 MED ORDER — CEFAZOLIN SODIUM-DEXTROSE 1-4 GM/50ML-% IV SOLN
1.0000 g | Freq: Three times a day (TID) | INTRAVENOUS | Status: DC
  Administered 2020-11-09: 1 g via INTRAVENOUS
  Filled 2020-11-09 (×3): qty 50

## 2020-11-09 MED ORDER — TETANUS-DIPHTH-ACELL PERTUSSIS 5-2.5-18.5 LF-MCG/0.5 IM SUSY: 0.5000 mL | PREFILLED_SYRINGE | Freq: Once | INTRAMUSCULAR | Status: DC

## 2020-11-09 MED ORDER — LIDOCAINE HCL (PF) 1 % IJ SOLN
INTRAMUSCULAR | Status: DC | PRN
  Administered 2020-11-09: 9 mL via EPIDURAL
  Administered 2020-11-09: 5 mL via EPIDURAL

## 2020-11-09 MED ORDER — OXYTOCIN-SODIUM CHLORIDE 30-0.9 UT/500ML-% IV SOLN
1.0000 m[IU]/min | INTRAVENOUS | Status: DC
  Administered 2020-11-09: 2 m[IU]/min via INTRAVENOUS

## 2020-11-09 MED ORDER — COCONUT OIL OIL
1.0000 | TOPICAL_OIL | Status: DC | PRN
Start: 2020-11-09 — End: 2020-11-11
  Administered 2020-11-11: 1 via TOPICAL

## 2020-11-09 MED ORDER — FENTANYL-BUPIVACAINE-NACL 0.5-0.125-0.9 MG/250ML-% EP SOLN
EPIDURAL | Status: DC | PRN
  Administered 2020-11-09: 12 mL/h via EPIDURAL

## 2020-11-09 MED ORDER — TERBUTALINE SULFATE 1 MG/ML IJ SOLN: 0.2500 mg | Freq: Once | INTRAMUSCULAR | Status: DC | PRN

## 2020-11-09 MED ORDER — ACETAMINOPHEN 325 MG PO TABS
650.0000 mg | ORAL_TABLET | ORAL | Status: DC | PRN
  Administered 2020-11-09: 650 mg via ORAL
  Filled 2020-11-09: qty 2

## 2020-11-09 MED ORDER — WITCH HAZEL-GLYCERIN EX PADS: 1.0000 "application " | MEDICATED_PAD | CUTANEOUS | Status: DC | PRN

## 2020-11-09 MED ORDER — LACTATED RINGERS IV SOLN: 500.0000 mL | INTRAVENOUS | Status: DC | PRN

## 2020-11-09 MED ORDER — IBUPROFEN 600 MG PO TABS
600.0000 mg | ORAL_TABLET | Freq: Four times a day (QID) | ORAL | Status: DC
  Administered 2020-11-09 – 2020-11-11 (×6): 600 mg via ORAL
  Filled 2020-11-09 (×7): qty 1

## 2020-11-09 MED ORDER — ONDANSETRON HCL 4 MG/2ML IJ SOLN: 4.0000 mg | Freq: Four times a day (QID) | INTRAMUSCULAR | Status: DC | PRN

## 2020-11-09 MED ORDER — PRENATAL MULTIVITAMIN CH
1.0000 | ORAL_TABLET | Freq: Every day | ORAL | Status: DC
  Administered 2020-11-11: 1 via ORAL
  Filled 2020-11-09: qty 1

## 2020-11-09 MED ORDER — DIPHENHYDRAMINE HCL 25 MG PO CAPS: 25.0000 mg | ORAL_CAPSULE | Freq: Four times a day (QID) | ORAL | Status: DC | PRN

## 2020-11-09 NOTE — Anesthesia Procedure Notes (Signed)
Epidural Patient location during procedure: OB Start time: 11/09/2020 8:28 AM End time: 11/09/2020 8:30 AM  Staffing Anesthesiologist: Lyn Hollingshead, MD Performed: anesthesiologist   Preanesthetic Checklist Completed: patient identified, IV checked, site marked, risks and benefits discussed, surgical consent, monitors and equipment checked, pre-op evaluation and timeout performed  Epidural Patient position: sitting Prep: DuraPrep and site prepped and draped Patient monitoring: continuous pulse ox and blood pressure Approach: midline Location: L3-L4 Injection technique: LOR air  Needle:  Needle type: Tuohy  Needle gauge: 17 G Needle length: 9 cm and 9 Needle insertion depth: 5 cm cm Catheter type: closed end flexible Catheter size: 19 Gauge Catheter at skin depth: 10 cm Test dose: negative and Other  Assessment Events: blood not aspirated, injection not painful, no injection resistance, no paresthesia and negative IV test  Additional Notes Reason for block:procedure for pain

## 2020-11-09 NOTE — Anesthesia Preprocedure Evaluation (Signed)
Anesthesia Evaluation  Patient identified by MRN, date of birth, ID band Patient awake    Reviewed: Allergy & Precautions, H&P , NPO status , Patient's Chart, lab work & pertinent test results  Airway Mallampati: I  TM Distance: >3 FB Neck ROM: full    Dental no notable dental hx.    Pulmonary    Pulmonary exam normal        Cardiovascular negative cardio ROS Normal cardiovascular exam     Neuro/Psych negative neurological ROS  negative psych ROS   GI/Hepatic negative GI ROS, Neg liver ROS,   Endo/Other  negative endocrine ROS  Renal/GU negative Renal ROS  negative genitourinary   Musculoskeletal negative musculoskeletal ROS (+)   Abdominal Normal abdominal exam  (+)   Peds  Hematology  (+) Blood dyscrasia, anemia ,   Anesthesia Other Findings   Reproductive/Obstetrics (+) Pregnancy                             Anesthesia Physical Anesthesia Plan  ASA: II  Anesthesia Plan: Epidural   Post-op Pain Management:    Induction:   PONV Risk Score and Plan:   Airway Management Planned:   Additional Equipment:   Intra-op Plan:   Post-operative Plan:   Informed Consent: I have reviewed the patients History and Physical, chart, labs and discussed the procedure including the risks, benefits and alternatives for the proposed anesthesia with the patient or authorized representative who has indicated his/her understanding and acceptance.       Plan Discussed with:   Anesthesia Plan Comments:         Anesthesia Quick Evaluation

## 2020-11-09 NOTE — Lactation Note (Signed)
This note was copied from a baby's chart. Lactation Consultation Note Asked mom if she would like to see Lactation at this time. Mom  Would like lactation to come back later.  Patient Name: Rachel Tyler [id number] Date: 11/09/2020   Age:21 hours  Maternal Data    Feeding Feeding Type: Breast Fed  LATCH Score                   Interventions    Lactation Tools Discussed/Used     Consult Status      Theodoro Kalata 11/09/2020, 11:07 PM

## 2020-11-09 NOTE — Progress Notes (Signed)
Labor Note  S: s/p epidural  O: BP 138/80   Pulse (!) 55   Temp 98.6 F (37 C) (Oral)   Resp 16   Ht 5\' 9"  (1.753 m)   Wt 87.7 kg   LMP 12/28/2019 (Approximate)   SpO2 100%   BMI 28.56 kg/m  CE: 2-3/50/-2 at 1025, deferred at this time FHR: Baseline 120, +accels, -decels, min to mod variability TOCO 1-5, pitocin at 45mU/min  A/P: This is a 20 y.o. G1P0000 at [redacted]w[redacted]d  admitted for IOL, GBS pos FWB: cat 1  MWB:s/p epidural; continue Ancef for GBS Labor course: latent labor   COVID pos, asx, continue isolation protocol Anticipate SVD

## 2020-11-09 NOTE — Lactation Note (Signed)
Lactation Consultation Note  Patient Name: Rachel Tyler [id number] Date: 11/09/2020   Age:20 y.o.   Labor and Delivery Initial Visit:  Mother is Covid +  Baby awake and STS on mother's chest when I arrived.  Offered to assist with latching and mother agreeable.  Assisted to latch to the right breast and baby took intermittent sucks for a total of 7 minutes.  Informed parents that lactation services will be available on the Mother/Baby unit and that she can call for latch assistance at any time.  Father present and observing at the bedside.  Rn in room to continue assessment during my visit.   Maternal Data    Feeding Feeding Type: Breast Fed  LATCH Score Latch: Repeated attempts needed to sustain latch, nipple held in mouth throughout feeding, stimulation needed to elicit sucking reflex.  Audible Swallowing: None  Type of Nipple: Everted at rest and after stimulation  Comfort (Breast/Nipple): Soft / non-tender  Hold (Positioning): Assistance needed to correctly position infant at breast and maintain latch.  LATCH Score: 6  Interventions    Lactation Tools Discussed/Used     Consult Status Consult Status: Follow-up Date: 11/09/20 Follow-up type: In-patient    [name] 11/09/2020, 4:59 PM

## 2020-11-09 NOTE — H&P (Addendum)
Rachel Tyler is a 20 y.o. female presenting for scheduled IOL. 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 GBS, amox allergy with no anaphylaxis. Rh neg s/p Rhogam OB History    Gravida  1   Para  0   Term  0   Preterm  0   AB  0   Living  0     SAB  0   IAB  0   Ectopic  0   Multiple  0   Live Births  0          Past Medical History:  Diagnosis Date  . Cancer ([place]) 01/2018   Dx'd with brain chordoma--cancerous--treated in [place]  . Dysmenorrhea   . Elevated prolactin level 2021  . Exercise-induced asthma    Past Surgical History:  Procedure Laterality Date  . BRAIN SURGERY  06/20/18, 08/26/2018   in [place], patient also had radiation for chordoma tumor--cancerous  . KNEE ARTHROSCOPY Left    x3   Family History: family history is not on file. Social History:  reports that she has never smoked. She has never used smokeless tobacco. She reports that she does not drink alcohol and does not use drugs.     Maternal Diabetes: No 1hr 125 Genetic Screening: Declined Maternal Ultrasounds/Referrals: Normal Fetal Ultrasounds or other Referrals:  None Maternal Substance Abuse:  No Significant Maternal Medications:  None Significant Maternal Lab Results:  Group B Strep positive Other Comments:  None  Review of Systems  Constitutional: Negative for chills and fever.  Respiratory: Negative for shortness of breath.   Cardiovascular: Negative for chest pain, palpitations and leg swelling.  Gastrointestinal: Negative for abdominal pain and vomiting.  Neurological: Negative for dizziness, weakness and headaches.  Psychiatric/Behavioral: Negative for suicidal ideas.   Maternal Medical History:  Fetal activity: Perceived fetal activity is normal.    Prenatal Complications - Diabetes: none.    Dilation: 2.5 Effacement (%): 50 Station: -3 Exam by:: Rachel Tyler Blood pressure  (!) 106/59, pulse 65, temperature 98.6 F (37 C), temperature source Oral, resp. rate 18, height 5\' 9"  (1.753 m), weight 87.7 kg, last menstrual period 12/28/2019. Exam Physical Exam Constitutional:      General: She is not in acute distress.    Appearance: She is well-developed and well-nourished.  HENT:     Head: Normocephalic and atraumatic.  Eyes:     Pupils: Pupils are equal, round, and reactive to light.  Cardiovascular:     Rate and Rhythm: Normal rate and regular rhythm.     Heart sounds: No murmur heard. No gallop.   Abdominal:     Tenderness: There is no abdominal tenderness. There is no guarding or rebound.  Genitourinary:    Vagina: Normal.     Uterus: Normal.   Musculoskeletal:        General: Normal range of motion.     Cervical back: Normal range of motion and neck supple.  Skin:    General: Skin is warm and dry.  Neurological:     Mental Status: She is alert and oriented to person, place, and time.    Category 1 tracing, Toco q1-89m Clear AROM @ 0750  Prenatal labs: ABO, Rh: --/--/O NEG (01/26 0103) Antibody: POS (01/26 0103) Rubella: Immune (07/14 0000) RPR: Nonreactive (07/14 0000)  HBsAg: Negative (07/14 0000)  HIV: Non-reactive (07/14 0000)  GBS: Positive/-- (01/10 0000)   Assessment/Plan: This  is a 20yo G1 @ 72 1/7 by 11 1/7 [place] c/w LMP, [place] 11/15/20 admitted for IOL at term. GBS pos, amox allergy, Ancef ordered. S/p AROM, desires epidural at this time. Baby boy, anticipate SVD. S/P cervical ripening with PV cytotec x2  Of note, screening COVID positive, patient asymptomatic at this time. Continue precautions   Melida Quitter Tagan Bartram 11/09/2020, 8:01 AM

## 2020-11-10 LAB — CBC
HCT: 26.8 % — ABNORMAL LOW (ref 36.0–46.0)
Hemoglobin: 8.7 g/dL — ABNORMAL LOW (ref 12.0–15.0)
MCH: 28.3 pg (ref 26.0–34.0)
MCHC: 32.5 g/dL (ref 30.0–36.0)
MCV: 87.3 fL (ref 80.0–100.0)
Platelets: 276 10*3/uL (ref 150–400)
RBC: 3.07 MIL/uL — ABNORMAL LOW (ref 3.87–5.11)
RDW: 13.3 % (ref 11.5–15.5)
WBC: 10.9 10*3/uL — ABNORMAL HIGH (ref 4.0–10.5)
nRBC: 0 % (ref 0.0–0.2)

## 2020-11-10 MED ORDER — RHO D IMMUNE GLOBULIN 1500 UNIT/2ML IJ SOSY
300.0000 ug | PREFILLED_SYRINGE | Freq: Once | INTRAMUSCULAR | Status: AC
  Administered 2020-11-10: 300 ug via INTRAMUSCULAR
  Filled 2020-11-10: qty 2

## 2020-11-10 MED ORDER — FERROUS SULFATE 325 (65 FE) MG PO TABS
325.0000 mg | ORAL_TABLET | Freq: Every day | ORAL | Status: DC
  Administered 2020-11-10 – 2020-11-11 (×2): 325 mg via ORAL
  Filled 2020-11-10 (×2): qty 1

## 2020-11-10 NOTE — Progress Notes (Signed)
Post Partum Day 1 Subjective: no complaints, up ad lib, voiding, tolerating PO, + flatus and lochia mild. Pt reports no lightheadedness, no CP, SOB, fever or chills. She has no symptoms of covid infection ( did not realize prior to being tested on admission). She is bonding well with baby. She would like a circumcision while in the hospital if able, otherwise desires in office.   Objective: Blood pressure (!) 105/53, pulse 64, temperature 98.2 F (36.8 C), temperature source Oral, resp. rate 16, height 5\' 9"  (1.753 m), weight 87.7 kg, last menstrual period 12/28/2019, SpO2 98 %, unknown if currently breastfeeding.  Physical Exam:  General: alert, cooperative and no distress Lochia: appropriate Uterine Fundus: firm Incision: n/a DVT Evaluation: No evidence of DVT seen on physical exam. No significant calf/ankle edema.  Recent Labs    11/09/20 0007 11/10/20 0736  HGB 10.3* 8.7*  HCT 31.1* 26.8*    Assessment/Plan: Plan for discharge tomorrow and Circumcision prior to discharge if able  - Anemia - on iron supps -Covid + on 11/09/20  - asymptomatic   LOS: 1 day   [place] 11/10/2020, 9:48 AM

## 2020-11-10 NOTE — Lactation Note (Signed)
This note was copied from a baby's chart. Lactation Consultation Note Baby 48 hrs old. Attempted to see mom. Mom stated baby just ate for 23 min. Mom stated BF going well.  Patient Name: Boy Brielynn Sekula [id number] Date: 11/10/2020   Age:21 hours  Maternal Data    Feeding    LATCH Score                   Interventions    Lactation Tools Discussed/Used     Consult Status      Theodoro Kalata 11/10/2020, 5:04 AM

## 2020-11-10 NOTE — Anesthesia Postprocedure Evaluation (Signed)
Anesthesia Post Note  Patient: Rachel Tyler  Procedure(s) Performed: AN AD HOC LABOR EPIDURAL     Patient location during evaluation: Mother Baby Anesthesia Type: Epidural Level of consciousness: awake and alert Pain management: pain level controlled Vital Signs Assessment: post-procedure vital signs reviewed and stable Respiratory status: spontaneous breathing, nonlabored ventilation and respiratory function stable Cardiovascular status: stable Postop Assessment: no headache, no backache and epidural receding Anesthetic complications: no   No complications documented.  Last Vitals:  Vitals:   11/10/20 0300 11/10/20 0748  BP: 115/66 (!) 105/53  Pulse: (!) 52 64  Resp: 18 16  Temp: 36.6 C 36.8 C  SpO2: 96% 98%    Last Pain:  Vitals:   11/10/20 0748  TempSrc: Oral  PainSc: 0-No pain   Pain Goal:                   Riki Sheer

## 2020-11-10 NOTE — Lactation Note (Signed)
This note was copied from a baby's chart. Lactation Consultation Note  Patient Name: Boy Sumayyah Custodio [id number] Date: 11/10/2020 Reason for consult: Follow-up assessment Age:20 years  Follow up to 20 years old infant with 2.15% weight loss at the time of consult. Infant is sleeping skin to skin with mother upon arrival. Parents state breastfeeding is going well and do not report any problems at this point.   Reviewed average size of a NB stomach. Encourage to follow babies' hunger and fullness cues. Reviewed importance to offer the breast 8 to 12 times in a 24-hour period for proper stimulation and to establish good milk supply.   Reviewed breastfeeding basics. Discussed milk coming to volume. Reviewed newborn behavior and expectations after 1 HOL. Reinforced hand expression and offering EBM to "top up" infant after feeding as well as making feedings effective while keeping infant awake at breast.  Feeding plan:  1. Breastfeed following hunger cues.  2. Offer breast 8 - 12 times in 24h period to establish good milk supply.   3. Pump or hand-express and offer EBM 4. Encouraged maternal rest, hydration and food intake.  5. Contact Lactation Services or local resources for support, questions or concerns.     All questions answered at this time.    Maternal Data Formula Feeding for Exclusion: No  Feeding Feeding Type: Breast Fed  LATCH Score Latch: Repeated attempts needed to sustain latch, nipple held in mouth throughout feeding, stimulation needed to elicit sucking reflex.  Audible Swallowing: A few with stimulation  Type of Nipple: Everted at rest and after stimulation  Comfort (Breast/Nipple): Soft / non-tender  Hold (Positioning): Assistance needed to correctly position infant at breast and maintain latch.  LATCH Score: 7  Interventions Interventions: Breast feeding basics reviewed;Skin to skin;Hand express;Expressed milk   Consult Status Consult Status:  Follow-up Date: 11/11/20 Follow-up type: In-patient    Falon Huesca A Higuera Ancidey 11/10/2020, 9:26 PM

## 2020-11-11 LAB — RH IG WORKUP (INCLUDES ABO/RH)
ABO/RH(D): O NEG
Fetal Screen: NEGATIVE
Gestational Age(Wks): 39.1
Unit division: 0

## 2020-11-11 MED ORDER — ACETAMINOPHEN 325 MG PO TABS: 650.0000 mg | ORAL_TABLET | ORAL | 1 refills | Status: AC | PRN

## 2020-11-11 MED ORDER — IBUPROFEN 600 MG PO TABS: 600.0000 mg | ORAL_TABLET | Freq: Four times a day (QID) | ORAL | 0 refills | Status: AC

## 2020-11-11 NOTE — Discharge Summary (Signed)
Postpartum Discharge Summary       Patient Name: Rachel Tyler DOB: Nov 20, 2000 MRN: [id number]  Date of admission: 11/09/2020 Delivery date:11/09/2020  Delivering provider: Deliah Boston  Date of discharge: 11/11/2020  Admitting diagnosis: [redacted] weeks gestation of pregnancy [Z3A.39] Intrauterine pregnancy: [redacted]w[redacted]d     Secondary diagnosis:  Active Problems:   [redacted] weeks gestation of pregnancy  Additional problems:COVID + 11/09/20--asymptomatic  h/o neurosurgery in 2019-no current issues     Discharge diagnosis: Term Pregnancy Delivered                                              Post partum procedures:none Augmentation: AROM, Pitocin and Cytotec Complications: None  Hospital course: Induction of Labor With Vaginal Delivery   20 y.o. yo G1P1001 at [redacted]w[redacted]d was admitted to the hospital 11/09/2020 for induction of labor.  Indication for induction: Elective.  Patient had an uncomplicated labor course as follows: Membrane Rupture Time/Date: 7:52 AM ,11/09/2020   Delivery Method:Vaginal, Spontaneous  Episiotomy: None  Lacerations:  Sulcus;Labial  Details of delivery can be found in separate delivery note.  Patient had a routine postpartum course. Patient is discharged home 11/11/20.  Newborn Data: Birth date:11/09/2020  Birth time:3:57 PM  Gender:Female  Living status:Living  Apgars:9 ,9  Weight:3164 g   Magnesium Sulfate received: No BMZ received: No Rhophylac:No   Physical exam  Vitals:   11/10/20 0748 11/10/20 1334 11/10/20 1926 11/11/20 0540  BP: (!) 105/53 110/68 129/77 110/64  Pulse: 64 65 63 61  Resp: 16 16    Temp: 98.2 F (36.8 C) 98.3 F (36.8 C) 98.1 F (36.7 C) 97.8 F (36.6 C)  TempSrc: Oral Oral Oral Oral  SpO2: 98%     Weight:      Height:       General: alert and cooperative Lochia: appropriate Uterine Fundus: firm  Labs: Lab Results  Component Value Date   WBC 10.9 (H) 11/10/2020   HGB 8.7 (L) 11/10/2020   HCT 26.8 (L) 11/10/2020   MCV 87.3  11/10/2020   PLT 276 11/10/2020   CMP Latest Ref Rng & Units 11/04/2020  Glucose 70 - 99 mg/dL 93  BUN 6 - 20 mg/dL 5(L)  Creatinine 0.44 - 1.00 mg/dL 0.62  Sodium 135 - 145 mmol/L 134(L)  Potassium 3.5 - 5.1 mmol/L 3.4(L)  Chloride 98 - 111 mmol/L 104  CO2 22 - 32 mmol/L 21(L)  Calcium 8.9 - 10.3 mg/dL 8.4(L)  Total Protein 6.5 - 8.1 g/dL 6.1(L)  Total Bilirubin 0.3 - 1.2 mg/dL 0.6  Alkaline Phos 38 - 126 U/L 112  AST 15 - 41 U/L 16  ALT 0 - 44 U/L 9   Edinburgh Score: Edinburgh Postnatal Depression Scale Screening Tool 11/11/2020  I have been able to laugh and see the funny side of things. 0  I have looked forward with enjoyment to things. 0  I have blamed myself unnecessarily when things went wrong. 1  I have been anxious or worried for no good reason. 1  I have felt scared or panicky for no good reason. 0  Things have been getting on top of me. 0  I have been so unhappy that I have had difficulty sleeping. 0  I have felt sad or miserable. 0  I have been so unhappy that I have been crying. 0  The thought of  harming myself has occurred to me. 0  Edinburgh Postnatal Depression Scale Total 2     After visit meds:  Allergies as of 11/11/2020      Reactions   Amoxicillin Hives   Shellfish-derived Products Hives      Medication List    STOP taking these medications   calcium carbonate 1250 (500 Ca) MG chewable tablet Commonly known as: OS-CAL   Mometasone Furoate Powd   terconazole 0.4 % vaginal cream Commonly known as: Terazol 7     TAKE these medications   acetaminophen 325 MG tablet Commonly known as: Tylenol Take 2 tablets (650 mg total) by mouth every 4 (four) hours as needed (for pain scale < 4).   albuterol 108 (90 Base) MCG/ACT inhaler Commonly known as: VENTOLIN HFA Inhale 1 puff into the lungs as needed.   beclomethasone 40 MCG/ACT inhaler Commonly known as: QVAR Inhale into the lungs as needed.   ibuprofen 600 MG tablet Commonly known as:  ADVIL Take 1 tablet (600 mg total) by mouth every 6 (six) hours.   Nasonex 50 MCG/ACT nasal spray Generic drug: mometasone ADD 1ML OF MEDICATION TO 240ML OF SALINE IN SALINE IRRIGATION BOTTLE; IRRIGATE SINUSES WITH 120ML THROUGH EACH NOSTRIL TWICE DAILY   prenatal multivitamin Tabs tablet Take 1 tablet by mouth daily at 12 noon.        Discharge home in stable condition Infant Feeding: Breast Infant Disposition:home with mother Discharge instruction: per After Visit Summary and Postpartum booklet. Activity: Advance as tolerated. Pelvic rest for 6 weeks.  Diet: routine diet Future Appointments:No future appointments. Follow up Visit:  Follow-up Information    Shivaji, Melida Quitter, MD. Schedule an appointment as soon as possible for a visit in 6 week(s).   Specialty: Obstetrics and Gynecology Why: routine postpartum Contact information: [address] [place] [id number] [phone]                Please schedule this patient for a In person postpartum visit in 6 weeks with the following provider: MD.  Delivery mode:  Vaginal, Spontaneous  Anticipated Birth Control:  Depo   11/11/2020 Logan Bores, MD

## 2020-11-11 NOTE — Progress Notes (Signed)
Post Partum Day 2 Subjective: no complaints, up ad lib and tolerating PO  Objective: Blood pressure 110/64, pulse 61, temperature 97.8 F (36.6 C), temperature source Oral, resp. rate 16, height 5\' 9"  (1.753 m), weight 87.7 kg, last menstrual period 12/28/2019, SpO2 98 %, unknown if currently breastfeeding.  Physical Exam:  General: alert and cooperative Lochia: appropriate Uterine Fundus: firm   Recent Labs    11/09/20 0007 11/10/20 0736  HGB 10.3* 8.7*  HCT 31.1* 26.8*    Assessment/Plan: Plan for discharge tomorrow   LOS: 2 days   Logan Bores 11/11/2020, 10:31 AM
